# Patient Record
Sex: Female | Born: 1960
Health system: Southern US, Community
[De-identification: ages and names within clinical notes are randomized; demographics above are authoritative.]

## PROBLEM LIST (undated history)

## (undated) DIAGNOSIS — R112 Nausea with vomiting, unspecified: Secondary | ICD-10-CM

## (undated) DIAGNOSIS — M1990 Unspecified osteoarthritis, unspecified site: Secondary | ICD-10-CM

## (undated) DIAGNOSIS — R0600 Dyspnea, unspecified: Secondary | ICD-10-CM

## (undated) DIAGNOSIS — I10 Essential (primary) hypertension: Secondary | ICD-10-CM

## (undated) DIAGNOSIS — K219 Gastro-esophageal reflux disease without esophagitis: Secondary | ICD-10-CM

## (undated) DIAGNOSIS — Z9889 Other specified postprocedural states: Secondary | ICD-10-CM

## (undated) DIAGNOSIS — Z9884 Bariatric surgery status: Secondary | ICD-10-CM

## (undated) DIAGNOSIS — J189 Pneumonia, unspecified organism: Secondary | ICD-10-CM

## (undated) DIAGNOSIS — G4730 Sleep apnea, unspecified: Secondary | ICD-10-CM

## (undated) HISTORY — PX: HERNIA REPAIR: SHX51

## (undated) HISTORY — PX: TUBAL LIGATION: SHX77

## (undated) HISTORY — PX: LAPAROSCOPIC GASTRIC BANDING: SHX1100

## (undated) HISTORY — PX: CERVICAL CONIZATION W/BX: SHX1330

## (undated) HISTORY — DX: Essential (primary) hypertension: I10

---

## 1998-06-13 ENCOUNTER — Other Ambulatory Visit: Admission: RE | Admit: 1998-06-13 | Discharge: 1998-06-13 | Payer: Self-pay | Admitting: Obstetrics and Gynecology

## 1999-02-09 ENCOUNTER — Emergency Department (HOSPITAL_COMMUNITY): Admission: EM | Admit: 1999-02-09 | Discharge: 1999-02-10 | Payer: Self-pay | Admitting: Emergency Medicine

## 1999-02-09 ENCOUNTER — Encounter: Payer: Self-pay | Admitting: Emergency Medicine

## 1999-02-10 ENCOUNTER — Encounter: Payer: Self-pay | Admitting: Emergency Medicine

## 2000-04-09 ENCOUNTER — Other Ambulatory Visit: Admission: RE | Admit: 2000-04-09 | Discharge: 2000-04-09 | Payer: Self-pay | Admitting: Obstetrics and Gynecology

## 2000-11-21 ENCOUNTER — Ambulatory Visit (HOSPITAL_COMMUNITY): Admission: RE | Admit: 2000-11-21 | Discharge: 2000-11-21 | Payer: Self-pay | Admitting: Family Medicine

## 2000-11-21 ENCOUNTER — Encounter: Payer: Self-pay | Admitting: Family Medicine

## 2001-05-07 ENCOUNTER — Ambulatory Visit (HOSPITAL_COMMUNITY): Admission: RE | Admit: 2001-05-07 | Discharge: 2001-05-07 | Payer: Self-pay | Admitting: Family Medicine

## 2001-05-07 ENCOUNTER — Encounter: Payer: Self-pay | Admitting: Family Medicine

## 2002-01-30 ENCOUNTER — Encounter: Payer: Self-pay | Admitting: *Deleted

## 2002-01-30 ENCOUNTER — Emergency Department (HOSPITAL_COMMUNITY): Admission: EM | Admit: 2002-01-30 | Discharge: 2002-01-30 | Payer: Self-pay | Admitting: *Deleted

## 2003-04-13 ENCOUNTER — Ambulatory Visit (HOSPITAL_COMMUNITY): Admission: RE | Admit: 2003-04-13 | Discharge: 2003-04-13 | Payer: Self-pay | Admitting: Family Medicine

## 2007-03-18 ENCOUNTER — Encounter: Admission: RE | Admit: 2007-03-18 | Discharge: 2007-03-18 | Payer: Self-pay | Admitting: Geriatric Medicine

## 2007-04-23 ENCOUNTER — Encounter: Admission: RE | Admit: 2007-04-23 | Discharge: 2007-04-23 | Payer: Self-pay | Admitting: Geriatric Medicine

## 2008-08-29 ENCOUNTER — Ambulatory Visit (HOSPITAL_COMMUNITY): Admission: RE | Admit: 2008-08-29 | Discharge: 2008-08-29 | Payer: Self-pay | Admitting: Surgery

## 2008-08-30 ENCOUNTER — Encounter: Admission: RE | Admit: 2008-08-30 | Discharge: 2008-11-28 | Payer: Self-pay | Admitting: Surgery

## 2008-09-06 ENCOUNTER — Ambulatory Visit (HOSPITAL_COMMUNITY): Admission: RE | Admit: 2008-09-06 | Discharge: 2008-09-06 | Payer: Self-pay | Admitting: Surgery

## 2008-09-06 ENCOUNTER — Ambulatory Visit (HOSPITAL_BASED_OUTPATIENT_CLINIC_OR_DEPARTMENT_OTHER): Admission: RE | Admit: 2008-09-06 | Discharge: 2008-09-06 | Payer: Self-pay | Admitting: Surgery

## 2008-09-17 ENCOUNTER — Ambulatory Visit: Payer: Self-pay | Admitting: Internal Medicine

## 2008-11-28 ENCOUNTER — Ambulatory Visit (HOSPITAL_COMMUNITY): Admission: RE | Admit: 2008-11-28 | Discharge: 2008-11-29 | Payer: Self-pay | Admitting: Surgery

## 2008-11-30 ENCOUNTER — Inpatient Hospital Stay (HOSPITAL_COMMUNITY): Admission: EM | Admit: 2008-11-30 | Discharge: 2008-12-01 | Payer: Self-pay | Admitting: Emergency Medicine

## 2008-12-12 ENCOUNTER — Encounter: Admission: RE | Admit: 2008-12-12 | Discharge: 2009-02-08 | Payer: Self-pay | Admitting: Surgery

## 2009-03-13 ENCOUNTER — Encounter: Admission: RE | Admit: 2009-03-13 | Discharge: 2009-04-24 | Payer: Self-pay | Admitting: Surgery

## 2009-06-26 ENCOUNTER — Encounter: Admission: RE | Admit: 2009-06-26 | Discharge: 2009-07-27 | Payer: Self-pay | Admitting: Surgery

## 2009-10-09 ENCOUNTER — Encounter: Admission: RE | Admit: 2009-10-09 | Discharge: 2009-11-09 | Payer: Self-pay | Admitting: Surgery

## 2009-12-11 ENCOUNTER — Encounter
Admission: RE | Admit: 2009-12-11 | Discharge: 2009-12-11 | Payer: Self-pay | Source: Home / Self Care | Attending: Surgery | Admitting: Surgery

## 2010-03-04 ENCOUNTER — Encounter: Payer: Self-pay | Admitting: Geriatric Medicine

## 2010-05-17 LAB — DIFFERENTIAL
Basophils Absolute: 0 10*3/uL (ref 0.0–0.1)
Basophils Relative: 0 % (ref 0–1)
Eosinophils Absolute: 0.1 10*3/uL (ref 0.0–0.7)
Eosinophils Relative: 1 % (ref 0–5)
Lymphocytes Relative: 17 % (ref 12–46)
Lymphocytes Relative: 17 % (ref 12–46)
Lymphocytes Relative: 26 % (ref 12–46)
Lymphs Abs: 1.1 10*3/uL (ref 0.7–4.0)
Monocytes Absolute: 0.5 10*3/uL (ref 0.1–1.0)
Monocytes Absolute: 0.6 10*3/uL (ref 0.1–1.0)
Monocytes Absolute: 0.6 10*3/uL (ref 0.1–1.0)
Monocytes Relative: 10 % (ref 3–12)
Monocytes Relative: 10 % (ref 3–12)
Monocytes Relative: 8 % (ref 3–12)
Neutro Abs: 3.7 10*3/uL (ref 1.7–7.7)
Neutro Abs: 4.6 10*3/uL (ref 1.7–7.7)
Neutrophils Relative %: 73 % (ref 43–77)

## 2010-05-17 LAB — CBC
HCT: 33.8 % — ABNORMAL LOW (ref 36.0–46.0)
HCT: 35.5 % — ABNORMAL LOW (ref 36.0–46.0)
Hemoglobin: 10.6 g/dL — ABNORMAL LOW (ref 12.0–15.0)
Hemoglobin: 11.3 g/dL — ABNORMAL LOW (ref 12.0–15.0)
MCHC: 33.4 g/dL (ref 30.0–36.0)
MCHC: 34 g/dL (ref 30.0–36.0)
MCV: 84.2 fL (ref 78.0–100.0)
MCV: 84.8 fL (ref 78.0–100.0)
Platelets: 275 10*3/uL (ref 150–400)
RBC: 3.69 MIL/uL — ABNORMAL LOW (ref 3.87–5.11)
RBC: 3.87 MIL/uL (ref 3.87–5.11)
RDW: 15.6 % — ABNORMAL HIGH (ref 11.5–15.5)
RDW: 15.6 % — ABNORMAL HIGH (ref 11.5–15.5)
RDW: 16 % — ABNORMAL HIGH (ref 11.5–15.5)
WBC: 6.9 10*3/uL (ref 4.0–10.5)

## 2010-05-17 LAB — COMPREHENSIVE METABOLIC PANEL
AST: 19 U/L (ref 0–37)
Albumin: 3.7 g/dL (ref 3.5–5.2)
Alkaline Phosphatase: 70 U/L (ref 39–117)
BUN: 13 mg/dL (ref 6–23)
Creatinine, Ser: 0.68 mg/dL (ref 0.4–1.2)
GFR calc Af Amer: >60 mL/min (ref >60)
Potassium: 4.4 mEq/L (ref 3.5–5.1)
Total Protein: 7.6 g/dL (ref 6.0–8.3)

## 2010-05-17 LAB — BASIC METABOLIC PANEL
BUN: 5 mg/dL — ABNORMAL LOW (ref 6–23)
BUN: 5 mg/dL — ABNORMAL LOW (ref 6–23)
CO2: 25 mEq/L (ref 19–32)
Calcium: 8.3 mg/dL — ABNORMAL LOW (ref 8.4–10.5)
Chloride: 106 mEq/L (ref 96–112)
Chloride: 107 mEq/L (ref 96–112)
Creatinine, Ser: 0.6 mg/dL (ref 0.4–1.2)
GFR calc non Af Amer: >60 mL/min (ref >60)
Glucose, Bld: 114 mg/dL — ABNORMAL HIGH (ref 70–99)
Potassium: 3.9 mEq/L (ref 3.5–5.1)
Sodium: 137 mEq/L (ref 135–145)

## 2010-05-17 LAB — PREGNANCY, URINE: Preg Test, Ur: NEGATIVE

## 2010-06-26 NOTE — Procedures (Signed)
NAMESAMIHA, DENAPOLI               ACCOUNT NO.:  [id number]   MEDICAL RECORD NO.:  [id number]          PATIENT TYPE:  OUT   LOCATION:  SLEEP CENTER                 FACILITY:  [facility]   PHYSICIAN:  Clinton D. Maple Hudson, MD, FCCP, FACPDATE OF BIRTH:  12/26/59   DATE OF STUDY:  09/06/2008                            NOCTURNAL POLYSOMNOGRAM   REFERRING PHYSICIAN:  Thornton Park. Daphine Deutscher, MD   INDICATION FOR STUDY:  Hypersomnia with sleep apnea.   EPWORTH SLEEPINESS SCORE:  Epworth sleepiness score 4/24, BMI 44.3,  weight 242 pounds, height 62 inches, neck 14.5 inches.   MEDICATIONS:  Home medications charted and reviewed.   SLEEP ARCHITECTURE:  Split study protocol.  During the diagnostic phase,  total sleep time 173.5 minutes with sleep efficiency 65.6%.  Stage I was  21%, stage II 65.1%, stage III 13.8%, REM absent.  Sleep latency 52.5  minutes.  Awake after sleep onset 30 minutes.  Arousal index 82.0  indicating increased EEG arousal.  No bedtime medication was reported.   RESPIRATORY DATA:  Split study protocol.  Apnea/hypopnea index (AHI)  21.1 per hour.  A total of 61 events were scored including 7 obstructive  apneas and 54 hypopneas.  Events were more common while supine.  CPAP  was then titrated to 12 CWP, AHI 0 per hour.  She wore a small ResMed  Quattro full face mask with heated humidifier.   OXYGEN DATA:  Moderately loud snoring before CPAP with oxygen  desaturation to a nadir of 85%.  After CPAP control, mean oxygen  saturation held at 97.1% on room air.   CARDIAC DATA:  Sinus rhythm with PVCs.   MOVEMENT-PARASOMNIA:  Periodic limb movement.  During the diagnostic  phase, there were 7 limb jerks associated with arousals, 2.4 per hour.  During the titration phase, there were 10 limb jerks with arousal for an  index of 3.4 per hour.   IMPRESSIONS-RECOMMENDATIONS:  1. Moderate obstructive sleep apnea/hypopnea syndrome, AHI 21.1 per      hour.  Events were more common while  supine.  Moderately loud      snoring with oxygen desaturation to a nadir of 85%.  2. Successful CPAP titration to 12 CWP, AHI 0 per hour.  She wore a      small ResMed Quattro full face mask with heated humidifier.      Snoring was prevented and oxygenation improved.  3. 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      Clinton D. Maple Hudson, MD, [facility], FACP  Diplomate, Biomedical engineer of Sleep Medicine  Electronically Signed     CDY/MEDQ  D:  09/17/2008 10:17:50  T:  09/17/2008 11:12:16  Job:  [id number]

## 2010-09-14 ENCOUNTER — Encounter (INDEPENDENT_AMBULATORY_CARE_PROVIDER_SITE_OTHER): Payer: Self-pay

## 2010-09-14 ENCOUNTER — Ambulatory Visit (INDEPENDENT_AMBULATORY_CARE_PROVIDER_SITE_OTHER): Payer: BC Managed Care – PPO | Admitting: Physician Assistant

## 2010-09-14 NOTE — Progress Notes (Signed)
  HISTORY: Deanna Edwards is a 50 y.o.female who received an AP-Large lap-band in October 2010 by Dr. Daphine Deutscher. She comes in today having last been seen in April and has been doing very well. She denies vomiting or regurgitation symptoms and says her hunger and portion sizes are under excellent control. She would like to maintain her current weight.  VITAL SIGNS: Filed Vitals:   09/14/10 1508  BP: 128/84    PHYSICAL EXAM: Physical exam reveals a very well-appearing 50 y.o.female in no apparent distress Neurologic: Awake, alert, oriented Psych: Bright affect, conversant Respiratory: Breathing even and unlabored. No stridor or wheezing Extremities: Atraumatic, good range of motion. Skin: Warm, Dry, no rashes Musculoskeletal: Normal gait, Joints normal  ASSESMENT: 50 y.o.  female  s/p AP-Large lap-band. She is doing very well.  PLAN: We discussed strategies to maintain her current weight. We'll have her back in 2 months for her 2 year anniversary appointment.

## 2010-09-14 NOTE — Patient Instructions (Signed)
Return in 2 months or sooner if necessary. 

## 2010-10-24 IMAGING — CR DG CHEST 2V
2 series · 2 of 2 positions shown · non-contrast
Comparison: April 13, 2003

CLINICAL DATA: Preoperative screening for bariatric procedure

CHEST - 2 VIEW

[view not recorded (1 of 2)]
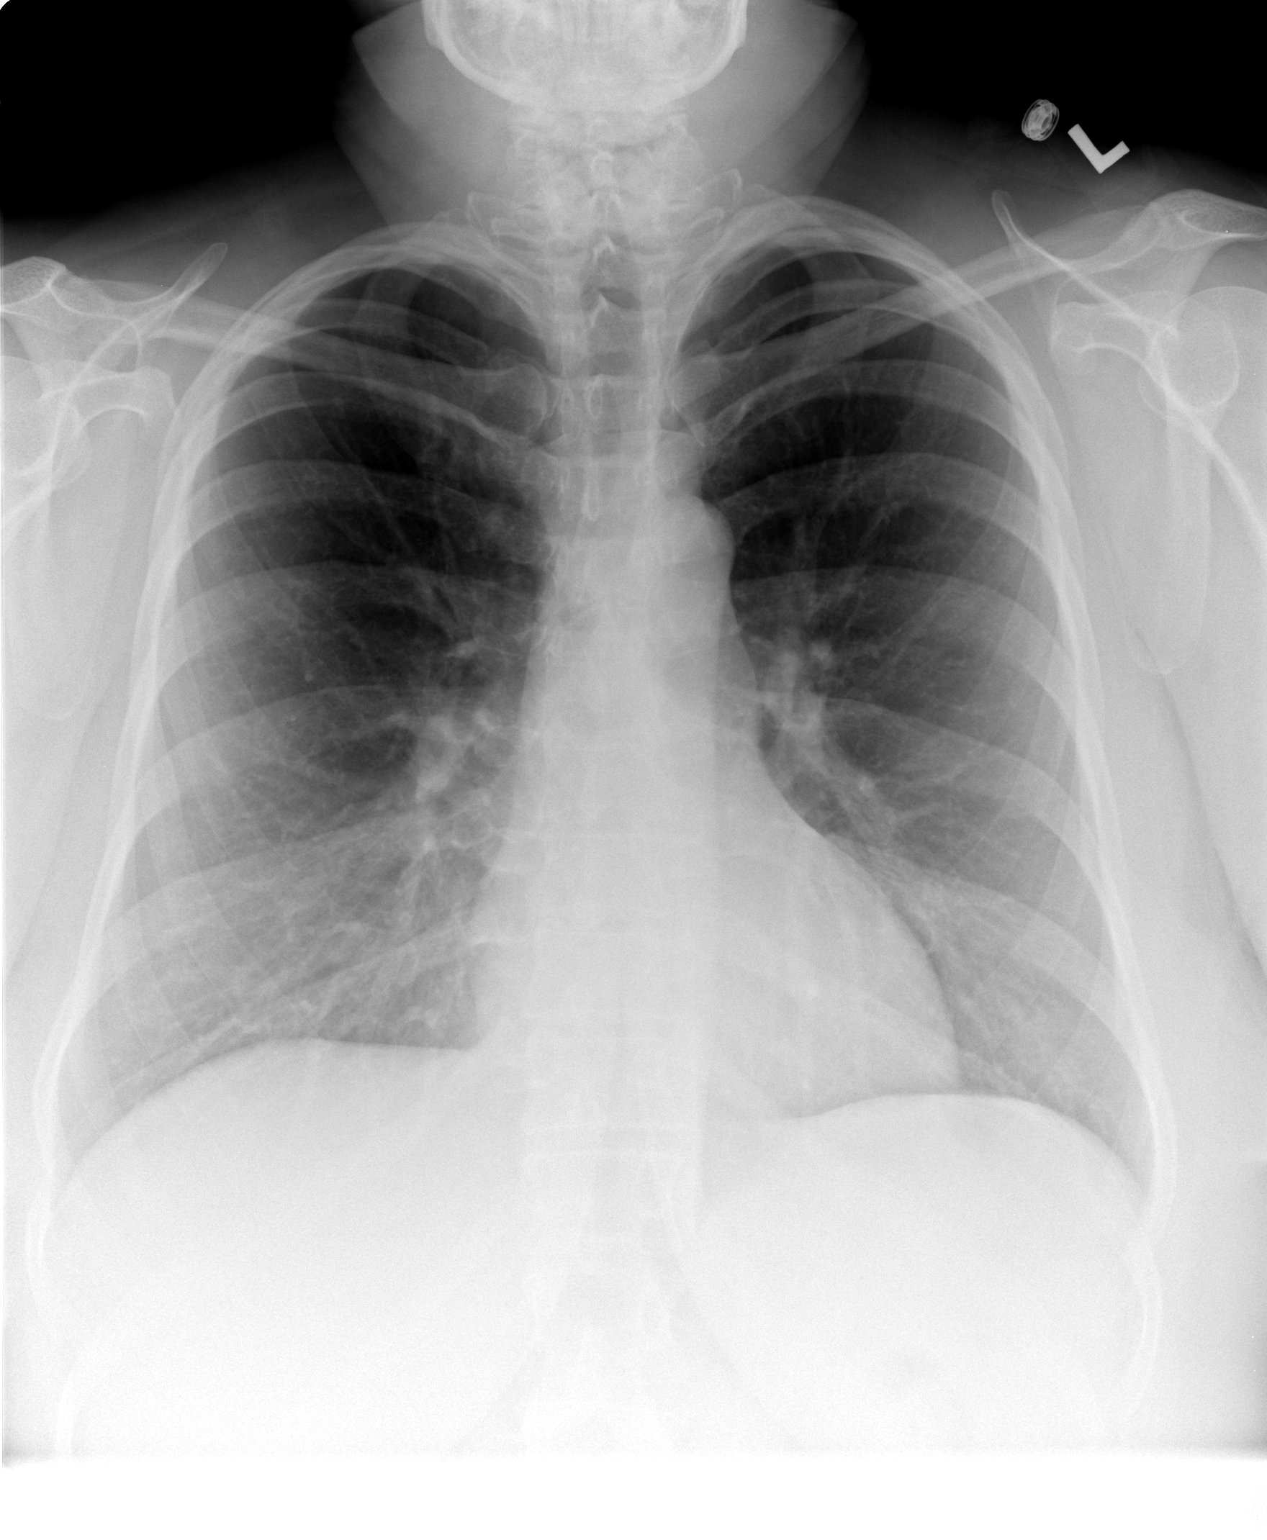

[view not recorded (2 of 2)]
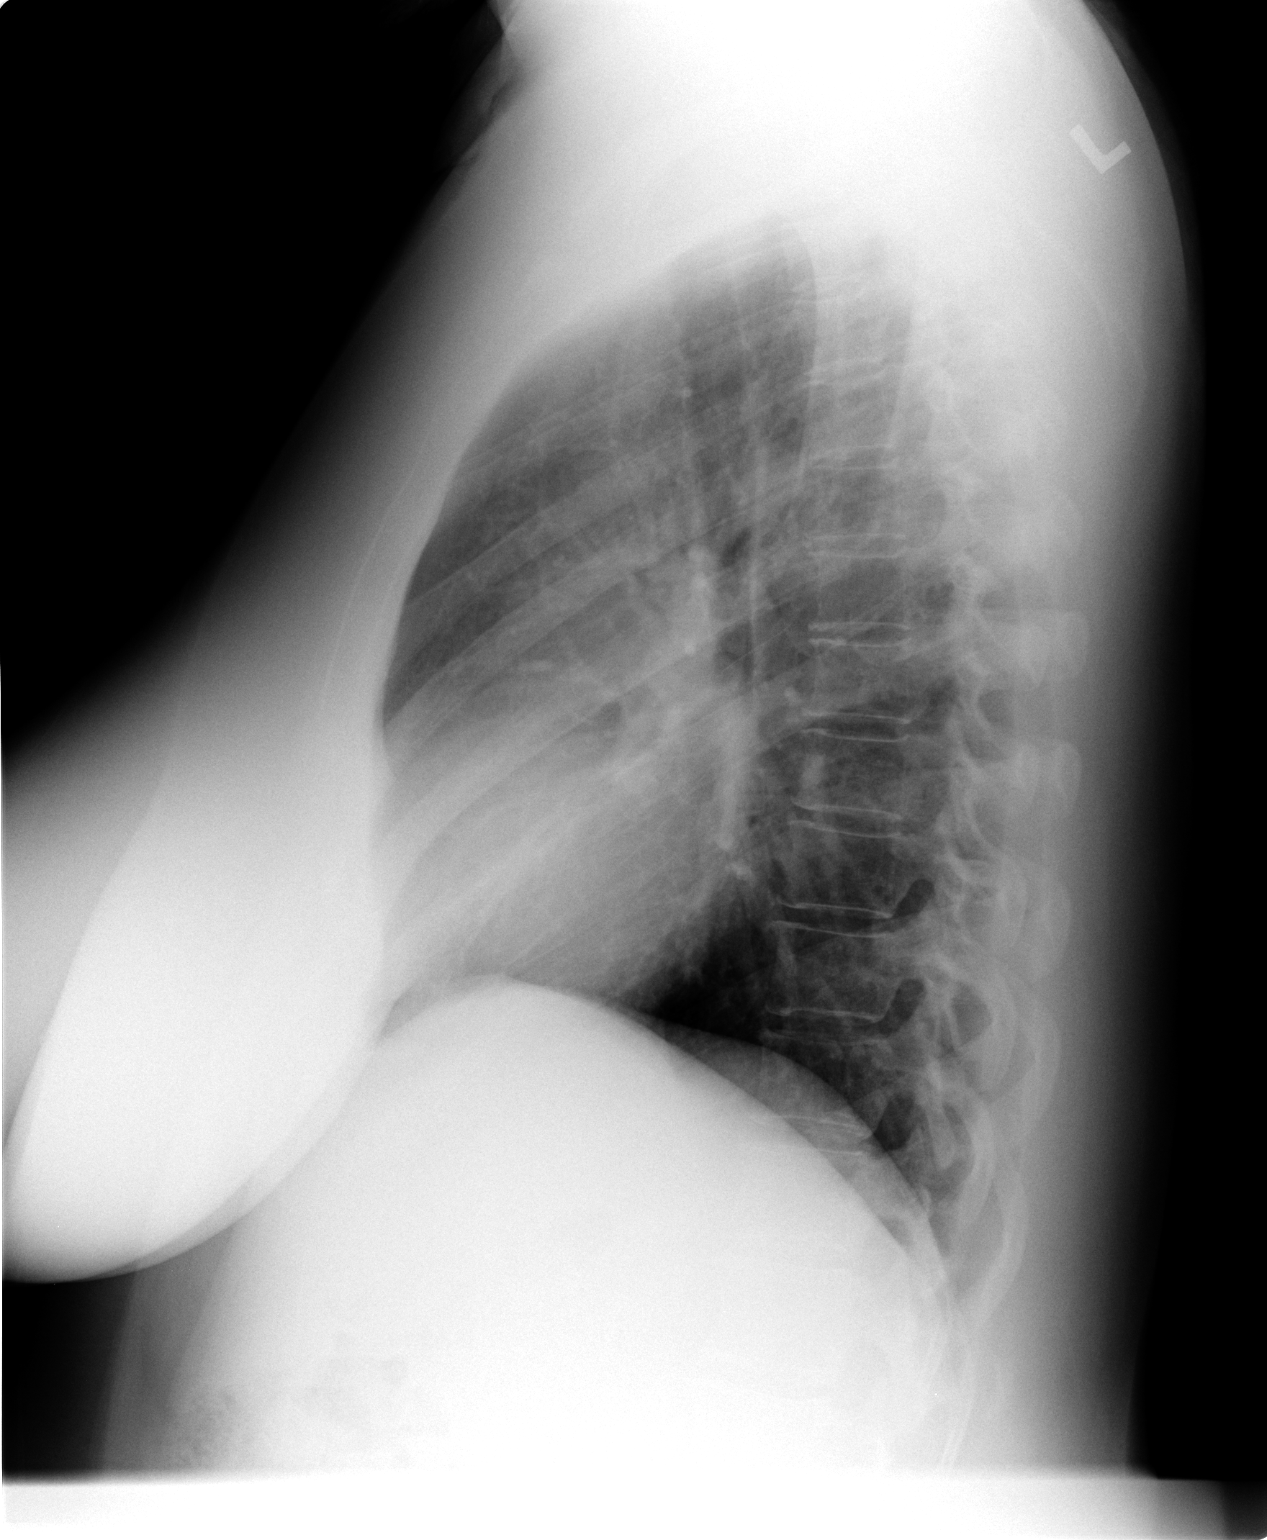

[2 of 2 positions shown; findings below may reference images not displayed]

FINDINGS: The cardiac silhouette, mediastinum, pulmonary
vasculature are within normal limits.  Both lungs are clear.  The
osseous structures are unremarkable.
IMPRESSION: Normal, stable chest x-ray.

## 2010-11-30 ENCOUNTER — Ambulatory Visit (INDEPENDENT_AMBULATORY_CARE_PROVIDER_SITE_OTHER): Payer: BC Managed Care – PPO | Admitting: Physician Assistant

## 2010-11-30 ENCOUNTER — Encounter (INDEPENDENT_AMBULATORY_CARE_PROVIDER_SITE_OTHER): Payer: Self-pay | Admitting: Physician Assistant

## 2010-11-30 VITALS — BP 130/86 | HR 66 | Temp 97.1°F | Resp 16 | Ht 62.0 in | Wt 134.8 lb

## 2010-11-30 DIAGNOSIS — Z4651 Encounter for fitting and adjustment of gastric lap band: Secondary | ICD-10-CM

## 2010-11-30 DIAGNOSIS — Z8639 Personal history of other endocrine, nutritional and metabolic disease: Secondary | ICD-10-CM

## 2010-11-30 DIAGNOSIS — Z87898 Personal history of other specified conditions: Secondary | ICD-10-CM

## 2010-11-30 NOTE — Progress Notes (Signed)
  HISTORY: Deanna Edwards is a 50 y.o.female who received an AP-Large lap-band in October 2010 by Dr. Daphine Deutscher. She comes in today having last been seen in August and has lost 7 more pounds. She doesn't want to lose any more weight. She says she's incorporated carbs back in her diet but simply isn't that hungry. She's like to try having a little fluid removed in the hope that it'll stabilize her weight.  VITAL SIGNS: Filed Vitals:   11/30/10 1020  BP: 130/86  Pulse: 66  Temp: 97.1 F (36.2 C)  Resp: 16    PHYSICAL EXAM: Physical exam reveals a very well-appearing 50 y.o.female in no apparent distress Neurologic: Awake, alert, oriented Psych: Bright affect, conversant Respiratory: Breathing even and unlabored. No stridor or wheezing Abdomen: Soft, nontender, nondistended to palpation. Incisions well-healed. No incisional hernias. Port easily palpated. Extremities: Atraumatic, good range of motion.  ASSESMENT: 50 y.o.  female  s/p AP-Large lap-band.   PLAN: The patient's port was accessed with a 20G Huber needle without difficulty. Clear fluid was aspirated and 0.2 mL saline was removed from the port to give a total predicted volume of 7.8 mL. The patient was able to swallow water without difficulty following the procedure and was instructed to take clear liquids for the next 24-48 hours and advance slowly as tolerated.

## 2010-11-30 NOTE — Patient Instructions (Signed)
Return in 6 months or as needed. Call for an appointment with Dr. Daphine Deutscher if you continue to lose weight.

## 2011-04-29 ENCOUNTER — Encounter (INDEPENDENT_AMBULATORY_CARE_PROVIDER_SITE_OTHER): Payer: Self-pay | Admitting: General Surgery

## 2012-05-07 ENCOUNTER — Encounter (INDEPENDENT_AMBULATORY_CARE_PROVIDER_SITE_OTHER): Payer: BC Managed Care – PPO

## 2012-06-25 ENCOUNTER — Encounter (INDEPENDENT_AMBULATORY_CARE_PROVIDER_SITE_OTHER): Payer: Self-pay

## 2012-06-25 ENCOUNTER — Encounter (INDEPENDENT_AMBULATORY_CARE_PROVIDER_SITE_OTHER): Payer: BC Managed Care – PPO

## 2012-06-25 ENCOUNTER — Ambulatory Visit
Admission: RE | Admit: 2012-06-25 | Discharge: 2012-06-25 | Disposition: A | Discharge status: Home / Self Care | Payer: BC Managed Care – PPO | Source: Ambulatory Visit | Attending: Physician Assistant | Admitting: Physician Assistant

## 2012-06-25 ENCOUNTER — Ambulatory Visit (INDEPENDENT_AMBULATORY_CARE_PROVIDER_SITE_OTHER): Payer: BC Managed Care – PPO | Admitting: Physician Assistant

## 2012-06-25 DIAGNOSIS — Z9884 Bariatric surgery status: Secondary | ICD-10-CM

## 2012-06-25 NOTE — Patient Instructions (Signed)
Obtain your x-ray today. Our office will contact you regarding scheduling your procedure. Continue to watch your portion sizes and food choices.

## 2012-06-25 NOTE — Progress Notes (Signed)
  HISTORY: Deanna Edwards is a 52 y.o.female who received an AP-Large lap-band in October 2010 by Dr. Daphine Deutscher. She was last seen in October 2012 for fluid removal secondary to excessive weight loss. She reports doing very well until February of this year when she fell ill with an upper respiratory ailment. Following her recovery she noticed that she was able to eat pretty much anything without restriction. This has persisted to the point that she's gained 38 lbs. She describes this lack of restriction as being literally 'overnight'.  VITAL SIGNS: Filed Vitals:   06/25/12 1045  BP: 130/78  Pulse: 76  Temp: 98.1 F (36.7 C)  Resp: 18    PHYSICAL EXAM: Physical exam reveals a very well-appearing 52 y.o.female in no apparent distress Neurologic: Awake, alert, oriented Psych: Bright affect, conversant Respiratory: Breathing even and unlabored. No stridor or wheezing Extremities: Atraumatic, good range of motion. Skin: Warm, Dry, no rashes Musculoskeletal: Normal gait, Joints normal Abdomen: Soft, nontender, nondistended. Port in good position. No tenderness or hernia at port site. Incisions intact without evidence of incisional hernia.  ASSESMENT: 52 y.o.  female  s/p AP-Large lap-band. Suspect port leak.  PLAN: I accessed her port with a 20 G huber needle. A very small (ca. 0.41mL) volume of what appeared to be old blood returned but no clear fluid. I added 3 mL saline, which I was unable to subsequently withdraw. I suspect a port leak. I've discussed the case with Dr. Daphine Deutscher. We will obtain a KUB today to assess port position, band position and whether the tubing is contiguous. We will schedule her for port revision at the earliest available opportunity. The plan was discussed with the patient in detail. She voiced understanding and agreement.

## 2012-07-01 ENCOUNTER — Telehealth (INDEPENDENT_AMBULATORY_CARE_PROVIDER_SITE_OTHER): Payer: Self-pay

## 2012-07-01 NOTE — Telephone Encounter (Signed)
Pt calling for x-ray results.  She would like Mardelle Matte to contact her to discuss follow up.  Please call her work number [phone].  She did say that a message could be left.

## 2012-07-02 ENCOUNTER — Telehealth (INDEPENDENT_AMBULATORY_CARE_PROVIDER_SITE_OTHER): Payer: Self-pay

## 2012-07-02 NOTE — Telephone Encounter (Signed)
Pt called asking if PA or MD has reviewed her imaging. I advised pt no msg in epic that imaging has been reviewed but I will route msg to Dr Daphine Deutscher and his assistant for review and to call pt asap with result.

## 2012-07-08 ENCOUNTER — Telehealth (INDEPENDENT_AMBULATORY_CARE_PROVIDER_SITE_OTHER): Payer: Self-pay | Admitting: General Surgery

## 2012-07-08 NOTE — Telephone Encounter (Signed)
Patient calling status post lap band placement stating that her lap band has a leak in it and she is supposed to follow up with Dr Daphine Deutscher. She has not heard anything. Please call patient. [phone].

## 2012-07-10 ENCOUNTER — Encounter (INDEPENDENT_AMBULATORY_CARE_PROVIDER_SITE_OTHER): Payer: Self-pay | Admitting: Surgery

## 2012-07-10 ENCOUNTER — Ambulatory Visit (INDEPENDENT_AMBULATORY_CARE_PROVIDER_SITE_OTHER): Payer: BC Managed Care – PPO | Admitting: Surgery

## 2012-07-10 VITALS — BP 160/82 | HR 68 | Temp 98.6°F | Resp 16 | Ht 62.0 in | Wt 173.8 lb

## 2012-07-10 DIAGNOSIS — Z9884 Bariatric surgery status: Secondary | ICD-10-CM

## 2012-07-10 NOTE — Progress Notes (Signed)
Chief Complaint:  Leaking lapband port  History of Present Illness:  Deanna Edwards is an 52 y.o. female who has had no restriction since February.  This occurred rather suddenly.  Mardelle Matte and I both have access her port and the fluid goes out immediately as if the tubing is not intact.  Xray showed the tubing to be in continuity but this cannot rule out a lacerated or tear at the port tubing junction  Past Medical History  Diagnosis Date  . Hypertension     Past Surgical History  Procedure Laterality Date  . Hernia repair    . Laparoscopic gastric banding      Current Outpatient Prescriptions  Medication Sig Dispense Refill  . azithromycin (ZITHROMAX) 250 MG tablet       . chlorhexidine (PERIDEX) 0.12 % solution       . HYDROcodone-acetaminophen (NORCO/VICODIN) 5-325 MG per tablet       . predniSONE (DELTASONE) 20 MG tablet        No current facility-administered medications for this visit.   Codeine and Sulfa antibiotics Family History  Problem Relation Age of Onset  . Hypertension Father   . Emphysema Father   . Asthma Father   . Hypertension Sister   . Cancer Maternal Grandmother     Breast   Social History:   reports that she quit smoking about 17 years ago. Her smoking use included Cigarettes. She smoked 0.00 packs per day. She has never used smokeless tobacco. She reports that she does not drink alcohol or use illicit drugs.   REVIEW OF SYSTEMS - PERTINENT POSITIVES ONLY: No changes  Physical Exam:   Blood pressure 160/82, pulse 68, temperature 98.6 F (37 C), temperature source Temporal, resp. rate 16, height 5\' 2"  (1.575 m), weight 173 lb 12.8 oz (78.835 kg), last menstrual period 06/25/2012. Body mass index is 31.78 kg/(m^2).  Gen:  WDWN WF NAD  Neurological: Alert and oriented to person, place, and time. Motor and sensory function is grossly intact  Head: Normocephalic and atraumatic.  Eyes: Conjunctivae are normal. Pupils are equal, round, and reactive to  light. No scleral icterus.  Neck: Normal range of motion. Neck supple. No tracheal deviation or thyromegaly present.  Cardiovascular:  SR without murmurs or gallops.  No carotid bruits Respiratory: Effort normal.  No respiratory distress. No chest wall tenderness. Breath sounds normal.  No wheezes, rales or rhonchi.  Abdomen:  Port easily accessed and 5 cc injected and was not retrievible GU: Musculoskeletal: Normal range of motion. Extremities are nontender. No cyanosis, edema or clubbing noted Lymphadenopathy: No cervical, preauricular, postauricular or axillary adenopathy is present Skin: Skin is warm and dry. No rash noted. No diaphoresis. No erythema. No pallor. Pscyh: Normal mood and affect. Behavior is normal. Judgment and thought content normal.   LABORATORY RESULTS: No results found for this or any previous visit (from the past 48 hour(s)).  RADIOLOGY RESULTS: No results found.  Problem List: There are no active problems to display for this patient.   Assessment & Plan: Leaking lapband port.      Matt B. Daphine Deutscher, MD, [facility] [phone] beeper [phone]  07/10/2012 5:34 PM

## 2012-07-10 NOTE — Patient Instructions (Addendum)
Thanks for your patience.  If you need further assistance after leaving the office, please call our office and speak with a [facility] nurse.  [phone].  If you want to leave a message for Dr. Shaylee Stanislawski, please call his office phone at [phone]. 

## 2012-08-06 NOTE — Progress Notes (Signed)
Surgery scheduled for 08/25/12.  Need orders in EPIC.  Thank You.  

## 2012-08-13 ENCOUNTER — Encounter (HOSPITAL_COMMUNITY): Payer: Self-pay | Admitting: Pharmacy Technician

## 2012-08-21 ENCOUNTER — Encounter (HOSPITAL_COMMUNITY)
Admission: RE | Admit: 2012-08-21 | Discharge: 2012-08-21 | Disposition: A | Discharge status: Home / Self Care | Payer: BC Managed Care – PPO | Source: Ambulatory Visit | Attending: Surgery | Admitting: Surgery

## 2012-08-21 ENCOUNTER — Ambulatory Visit (HOSPITAL_COMMUNITY)
Admission: RE | Admit: 2012-08-21 | Discharge: 2012-08-21 | Disposition: A | Discharge status: Home / Self Care | Payer: BC Managed Care – PPO | Source: Ambulatory Visit | Attending: Surgery | Admitting: Surgery

## 2012-08-21 ENCOUNTER — Encounter (HOSPITAL_COMMUNITY): Payer: Self-pay

## 2012-08-21 DIAGNOSIS — K9509 Other complications of gastric band procedure: Secondary | ICD-10-CM | POA: Insufficient documentation

## 2012-08-21 DIAGNOSIS — Z01810 Encounter for preprocedural cardiovascular examination: Secondary | ICD-10-CM | POA: Insufficient documentation

## 2012-08-21 DIAGNOSIS — Z9884 Bariatric surgery status: Secondary | ICD-10-CM

## 2012-08-21 HISTORY — DX: Sleep apnea, unspecified: G47.30

## 2012-08-21 HISTORY — DX: Bariatric surgery status: Z98.84

## 2012-08-21 HISTORY — DX: Gastro-esophageal reflux disease without esophagitis: K21.9

## 2012-08-21 LAB — BASIC METABOLIC PANEL
BUN: 9 mg/dL (ref 6–23)
CO2: 27 mEq/L (ref 19–32)
Glucose, Bld: 87 mg/dL (ref 70–99)
Potassium: 4.2 mEq/L (ref 3.5–5.1)
Sodium: 139 mEq/L (ref 135–145)

## 2012-08-21 NOTE — Patient Instructions (Addendum)
20 Marlaya Coke  08/21/2012   Your procedure is scheduled on  7-15  -2014  Report to [facility] at   0830     AM.  Call this number if you have problems the morning of surgery: [phone]  Or Presurgical Testing [phone](Zynasia Burklow)   For Cpap use: Bring mask and tubing only.   Do not eat food:After Midnight.    Take these medicines the morning of surgery with A SIP OF WATER: none   Do not wear jewelry, make-up or nail polish.  Do not wear lotions, powders, or perfumes. You may wear deodorant.  Do not shave 12 hours prior to first CHG shower(legs and under arms).(face and neck okay.)  Do not bring valuables to the hospital.  Contacts, dentures or bridgework,body piercing,  may not be worn into surgery.  Leave suitcase in the car. After surgery it may be brought to your room.  For patients admitted to the hospital, checkout time is 11:00 AM the day of discharge.   Patients discharged the day of surgery will not be allowed to drive home. Must have responsible person with you x 24 hours once discharged.  Name and phone number of your driver: Mayzee Reichenbach [phone] cell  Special Instructions: CHG(Chlorhedine 4%-"Hibiclens","Betasept","Aplicare") Shower Use Special Wash: see special instructions.(avoid face and genitals.)    Failure to follow these instructions may result in Cancellation of your surgery.   Patient signature_______________________________________________________

## 2012-08-21 NOTE — Progress Notes (Signed)
08-21-12 1000 -pt. Here now for presurgical testing visit-Need MD order entry- ASAP.

## 2012-08-21 NOTE — Pre-Procedure Instructions (Signed)
08-21-12 EKG/ CXR done today. 

## 2012-08-24 ENCOUNTER — Other Ambulatory Visit (INDEPENDENT_AMBULATORY_CARE_PROVIDER_SITE_OTHER): Payer: Self-pay | Admitting: Surgery

## 2012-08-24 ENCOUNTER — Telehealth (INDEPENDENT_AMBULATORY_CARE_PROVIDER_SITE_OTHER): Payer: Self-pay

## 2012-08-24 ENCOUNTER — Encounter (INDEPENDENT_AMBULATORY_CARE_PROVIDER_SITE_OTHER): Payer: Self-pay

## 2012-08-24 NOTE — Telephone Encounter (Signed)
Spoke with pt letting her know that I have her postop appt scheduled for August 6.  That I will write a note keeping her out of work until that date.  Pt wondered if today could be considered part of her FMLA since she is having surgery tomorrow.  I let her know that I didn't see that being an issue since she has personal things to take care of as well as a possible bowel prep prior to surgery.  Pt will come into the office today to pick up letter.

## 2012-08-25 ENCOUNTER — Ambulatory Visit (HOSPITAL_COMMUNITY): Payer: BC Managed Care – PPO | Admitting: Anesthesiology

## 2012-08-25 ENCOUNTER — Ambulatory Visit (HOSPITAL_COMMUNITY)
Admission: RE | Admit: 2012-08-25 | Discharge: 2012-08-25 | Disposition: A | Discharge status: Home / Self Care | Payer: BC Managed Care – PPO | Source: Ambulatory Visit | Attending: Surgery | Admitting: Surgery

## 2012-08-25 ENCOUNTER — Encounter (HOSPITAL_COMMUNITY): Payer: Self-pay | Admitting: Anesthesiology

## 2012-08-25 ENCOUNTER — Encounter (HOSPITAL_COMMUNITY)
Admission: RE | Disposition: A | Discharge status: Home / Self Care | Payer: Self-pay | Source: Ambulatory Visit | Attending: Surgery

## 2012-08-25 ENCOUNTER — Encounter (HOSPITAL_COMMUNITY): Payer: Self-pay | Admitting: *Deleted

## 2012-08-25 DIAGNOSIS — T85698A Other mechanical complication of other specified internal prosthetic devices, implants and grafts, initial encounter: Secondary | ICD-10-CM

## 2012-08-25 DIAGNOSIS — Y831 Surgical operation with implant of artificial internal device as the cause of abnormal reaction of the patient, or of later complication, without mention of misadventure at the time of the procedure: Secondary | ICD-10-CM | POA: Insufficient documentation

## 2012-08-25 DIAGNOSIS — I10 Essential (primary) hypertension: Secondary | ICD-10-CM | POA: Insufficient documentation

## 2012-08-25 DIAGNOSIS — K9509 Other complications of gastric band procedure: Secondary | ICD-10-CM | POA: Insufficient documentation

## 2012-08-25 HISTORY — PX: GASTRIC BANDING PORT REVISION: SHX5246

## 2012-08-25 LAB — PREGNANCY, URINE: Preg Test, Ur: NEGATIVE

## 2012-08-25 SURGERY — GASTRIC BANDING PORT REVISION
Anesthesia: General | Site: Abdomen | Wound class: Clean

## 2012-08-25 MED ORDER — CEFAZOLIN SODIUM-DEXTROSE 2-3 GM-% IV SOLR
INTRAVENOUS | Status: AC
  Filled 2012-08-25: qty 50

## 2012-08-25 MED ORDER — EPHEDRINE SULFATE 50 MG/ML IJ SOLN
INTRAMUSCULAR | Status: DC | PRN
  Administered 2012-08-25: 5 mg via INTRAVENOUS

## 2012-08-25 MED ORDER — HYDROMORPHONE HCL PF 1 MG/ML IJ SOLN
INTRAMUSCULAR | Status: AC
  Filled 2012-08-25: qty 1

## 2012-08-25 MED ORDER — LACTATED RINGERS IV SOLN
INTRAVENOUS | Status: DC
  Administered 2012-08-25: 1000 mL via INTRAVENOUS

## 2012-08-25 MED ORDER — SODIUM CHLORIDE 0.9 % IJ SOLN: 3.0000 mL | Freq: Two times a day (BID) | INTRAMUSCULAR | Status: DC

## 2012-08-25 MED ORDER — GLYCOPYRROLATE 0.2 MG/ML IJ SOLN
INTRAMUSCULAR | Status: DC | PRN
  Administered 2012-08-25: .7 mg via INTRAVENOUS

## 2012-08-25 MED ORDER — SODIUM CHLORIDE 0.9 % IJ SOLN
INTRAMUSCULAR | Status: DC | PRN
  Administered 2012-08-25: 5 mL via INTRAVENOUS

## 2012-08-25 MED ORDER — MIDAZOLAM HCL 5 MG/5ML IJ SOLN
INTRAMUSCULAR | Status: DC | PRN
  Administered 2012-08-25: 2 mg via INTRAVENOUS

## 2012-08-25 MED ORDER — 0.9 % SODIUM CHLORIDE (POUR BTL) OPTIME
TOPICAL | Status: DC | PRN
  Administered 2012-08-25: 1000 mL

## 2012-08-25 MED ORDER — SODIUM CHLORIDE 0.9 % IV SOLN: 250.0000 mL | INTRAVENOUS | Status: DC | PRN

## 2012-08-25 MED ORDER — BUPIVACAINE-EPINEPHRINE 0.5% -1:200000 IJ SOLN
INTRAMUSCULAR | Status: DC | PRN
  Administered 2012-08-25: 50 mL

## 2012-08-25 MED ORDER — OXYCODONE HCL 5 MG PO TABS: 5.0000 mg | ORAL_TABLET | ORAL | Status: DC | PRN

## 2012-08-25 MED ORDER — FENTANYL CITRATE 0.05 MG/ML IJ SOLN
INTRAMUSCULAR | Status: DC | PRN
  Administered 2012-08-25: 50 ug via INTRAVENOUS
  Administered 2012-08-25: 100 ug via INTRAVENOUS

## 2012-08-25 MED ORDER — ROCURONIUM BROMIDE 100 MG/10ML IV SOLN
INTRAVENOUS | Status: DC | PRN
  Administered 2012-08-25: 40 mg via INTRAVENOUS

## 2012-08-25 MED ORDER — CEFAZOLIN SODIUM-DEXTROSE 2-3 GM-% IV SOLR
2.0000 g | INTRAVENOUS | Status: AC
  Administered 2012-08-25: 2 g via INTRAVENOUS

## 2012-08-25 MED ORDER — LACTATED RINGERS IV SOLN
INTRAVENOUS | Status: DC
  Administered 2012-08-25: 125 mL/h via INTRAVENOUS

## 2012-08-25 MED ORDER — ACETAMINOPHEN 650 MG RE SUPP
650.0000 mg | RECTAL | Status: DC | PRN
  Filled 2012-08-25: qty 1

## 2012-08-25 MED ORDER — PROMETHAZINE HCL 25 MG/ML IJ SOLN
6.2500 mg | INTRAMUSCULAR | Status: DC | PRN
Start: 2012-08-25 — End: 2012-08-25
  Administered 2012-08-25: 6.25 mg via INTRAVENOUS
  Filled 2012-08-25: qty 1

## 2012-08-25 MED ORDER — PROPOFOL 10 MG/ML IV BOLUS
INTRAVENOUS | Status: DC | PRN
  Administered 2012-08-25: 150 mg via INTRAVENOUS

## 2012-08-25 MED ORDER — ONDANSETRON HCL 4 MG/2ML IJ SOLN: 4.0000 mg | Freq: Four times a day (QID) | INTRAMUSCULAR | Status: DC | PRN

## 2012-08-25 MED ORDER — NEOSTIGMINE METHYLSULFATE 1 MG/ML IJ SOLN
INTRAMUSCULAR | Status: DC | PRN
  Administered 2012-08-25: 4 mg via INTRAVENOUS

## 2012-08-25 MED ORDER — ONDANSETRON HCL 4 MG/2ML IJ SOLN
INTRAMUSCULAR | Status: DC | PRN
  Administered 2012-08-25: 4 mg via INTRAVENOUS

## 2012-08-25 MED ORDER — HEPARIN SODIUM (PORCINE) 5000 UNIT/ML IJ SOLN
5000.0000 [IU] | INTRAMUSCULAR | Status: AC
  Administered 2012-08-25: 5000 [IU] via SUBCUTANEOUS
  Filled 2012-08-25: qty 1

## 2012-08-25 MED ORDER — SODIUM CHLORIDE 0.9 % IJ SOLN: 3.0000 mL | INTRAMUSCULAR | Status: DC | PRN

## 2012-08-25 MED ORDER — BUPIVACAINE-EPINEPHRINE 0.5% -1:200000 IJ SOLN
INTRAMUSCULAR | Status: AC
  Filled 2012-08-25: qty 1

## 2012-08-25 MED ORDER — HYDROMORPHONE HCL PF 1 MG/ML IJ SOLN
0.2500 mg | INTRAMUSCULAR | Status: DC | PRN
  Administered 2012-08-25: 0.5 mg via INTRAVENOUS
  Administered 2012-08-25 (×2): 0.25 mg via INTRAVENOUS

## 2012-08-25 MED ORDER — ACETAMINOPHEN 325 MG PO TABS: 650.0000 mg | ORAL_TABLET | ORAL | Status: DC | PRN

## 2012-08-25 MED ORDER — OXYCODONE-ACETAMINOPHEN 5-325 MG PO TABS: 1.0000 | ORAL_TABLET | ORAL | Status: DC | PRN

## 2012-08-25 MED ORDER — LIDOCAINE HCL (CARDIAC) 20 MG/ML IV SOLN
INTRAVENOUS | Status: DC | PRN
  Administered 2012-08-25: 50 mg via INTRAVENOUS

## 2012-08-25 SURGICAL SUPPLY — 29 items
BENZOIN TINCTURE PRP APPL 2/3 (GAUZE/BANDAGES/DRESSINGS) IMPLANT
BLADE HEX COATED 2.75 (ELECTRODE) ×2 IMPLANT
CANISTER SUCTION 2500CC (MISCELLANEOUS) ×2 IMPLANT
CLOTH BEACON ORANGE TIMEOUT ST (SAFETY) ×2 IMPLANT
DECANTER SPIKE VIAL GLASS SM (MISCELLANEOUS) ×4 IMPLANT
DERMABOND ADVANCED (GAUZE/BANDAGES/DRESSINGS) ×1
DERMABOND ADVANCED .7 DNX12 (GAUZE/BANDAGES/DRESSINGS) ×1 IMPLANT
DRAPE LAPAROSCOPIC ABDOMINAL (DRAPES) ×2 IMPLANT
ELECT REM PT RETURN 9FT ADLT (ELECTROSURGICAL) ×2
ELECTRODE REM PT RTRN 9FT ADLT (ELECTROSURGICAL) ×1 IMPLANT
GLOVE BIOGEL M 8.0 STRL (GLOVE) ×2 IMPLANT
GOWN STRL NON-REIN LRG LVL3 (GOWN DISPOSABLE) IMPLANT
GOWN STRL REIN XL XLG (GOWN DISPOSABLE) ×4 IMPLANT
KIT ACCESS PORT VG (Band) ×2 IMPLANT
KIT BASIN OR (CUSTOM PROCEDURE TRAY) ×2 IMPLANT
MESH HERNIA 1X4 RECT BARD (Mesh General) ×1 IMPLANT
MESH HERNIA BARD 1X4 (Mesh General) ×1 IMPLANT
NEEDLE HYPO 22GX1.5 SAFETY (NEEDLE) ×2 IMPLANT
NS IRRIG 1000ML POUR BTL (IV SOLUTION) ×2 IMPLANT
PACK GENERAL/GYN (CUSTOM PROCEDURE TRAY) ×2 IMPLANT
STAPLER VISISTAT 35W (STAPLE) ×2 IMPLANT
STRIP CLOSURE SKIN 1/2X4 (GAUZE/BANDAGES/DRESSINGS) IMPLANT
SUT PROLENE 2 0 CT2 30 (SUTURE) ×2 IMPLANT
SUT VIC AB 2-0 SH 27 (SUTURE)
SUT VIC AB 2-0 SH 27X BRD (SUTURE) IMPLANT
SUT VIC AB 4-0 SH 18 (SUTURE) ×2 IMPLANT
SYR 30ML LL (SYRINGE) ×2 IMPLANT
SYR BULB IRRIGATION 50ML (SYRINGE) ×2 IMPLANT
SYRINGE 10CC LL (SYRINGE) ×2 IMPLANT

## 2012-08-25 NOTE — Transfer of Care (Signed)
Immediate Anesthesia Transfer of Care Note  Patient: Deanna Edwards  Procedure(s) Performed: Procedure(s) (LRB): GASTRIC BANDING PORT REVISION (N/A)  Patient Location: PACU  Anesthesia Type: General  Level of Consciousness: sedated, patient cooperative and responds to stimulaton  Airway & Oxygen Therapy: Patient Spontanous Breathing and Patient connected to face mask oxgen  Post-op Assessment: Report given to PACU RN and Post -op Vital signs reviewed and stable  Post vital signs: Reviewed and stable  Complications: No apparent anesthesia complications

## 2012-08-25 NOTE — Interval H&P Note (Signed)
History and Physical Interval Note:  08/25/2012 1:02 PM  Deanna Edwards  has presented today for surgery, with the diagnosis of lap band port leak  The various methods of treatment have been discussed with the patient and family. After consideration of risks, benefits and other options for treatment, the patient has consented to  Procedure(s) with comments: GASTRIC BANDING PORT REVISION (N/A) - Lap Band Port Replacement as a surgical intervention .  The patient's history has been reviewed, patient examined, no change in status, stable for surgery.  I have reviewed the patient's chart and labs.  Questions were answered to the patient's satisfaction.     Haeden Hudock B

## 2012-08-25 NOTE — Op Note (Signed)
Surgeon: Wenda Low, MD, FACS  Asst:  none  Anes:  general  Procedure: Replacement of leaking lapband port:  Leaking part identified in the intraabdominal tubing just near but not involved with the junction and on the part of the tube contiguous with the actual port  Diagnosis: Leaking lapband port  Complications: none  EBL:   minimal cc  Description of Procedure:  The patient was taken to room 1 and given general anesthesia.  A time out was performed after prepping with PCMX.  The old incision was reincised and the port and mesh removed.;  The port was injected with saline and active extravasation was seen coming from the tubing in the abdominal wall .  The tubing was pulled out of the abdomen and the hole was found just before the connection.  The tubing was cut proximally and a new port with mesh on the back was connected and tucked into the lateral pocket.  The tubing was threaded back into the abdomen.  The wound was closed with 4-0 vicryl and Dermabond.    Matt B. Daphine Deutscher, MD, [facility], [place] [phone]

## 2012-08-25 NOTE — Discharge Instructions (Signed)
Gastric Banding Surgery Care After Refer to this sheet in the next few weeks. These discharge instructions provide you with general information on caring for yourself after you leave the hospital. Your caregiver may also give you specific instructions. Your treatment has been planned according to the most current medical practices available, but unavoidable complications sometimes occur. If you have any problems or questions after discharge, call your caregiver. HOME CARE INSTRUCTIONS  Activity  Take frequent walks throughout the day. This will help to prevent blood clots. Do not sit for longer than 45 minutes to 1 hour while awake for 4 to 6 weeks after surgery.  Continue to do coughing and deep breathing exercises once you get home. This will help to prevent pneumonia.  Do not do strenuous activities, such as heavy lifting, pushing, or pulling, until after your follow-up visit with your caregiver. Do not lift anything heavier than 10 lb (4.5 kg).  Talk with your caregiver about when you may return to work and your exercise routine.  Do not drive while taking prescription pain medicine. Nutrition  It is very important that you drink at least 80 oz (2,400 mL) of fluid a day.  You should stay on a clear liquid diet until your follow-up visit with your caregiver. Keep sugar-free, clear liquid items on hand, including:  Tea: hot or cold. Drink only decaffeinated for the first month.  Broths: clear beef, chicken, vegetable.  Others: water, sugar-free frozen ice pops, flavored water, gelatin (after 1 week).  Do not consume caffeine for 1 month. Large amounts of caffeine can cause dehydration.  A dietician may also give you specific instructions.  Follow your caregiver's recommendations about vitamins and protein requirements after surgery. Hygiene  You may shower and wash your hair 2 days after surgery. Pat incisions dry. Do not rub incisions with a washcloth or towel.  Follow your  caregiver's recommendations about baths and pools following surgery. Pain control  If a prescription medicine was given, follow your caregiver's directions.  You may feel some gas pain caused by the carbon dioxide used to inflate your abdomen during surgery. This pain can be felt in your chest, shoulder, back, or abdominal area. Moving around often is advised. Incision care You may have 4 or more small incisions. They are closed with skin adhesive strips and have a clear plastic covering over them. You may remove your dressings the number of days directed by your caregiver after surgery. Check your incisions and surrounding area daily for any redness, swelling, discoloration, fluid (drainage), or bleeding. Dark red, dried blood may appear under these coverings. This is normal. SEEK MEDICAL CARE IF:   You develop persistent nausea and vomiting.  You have pain and discomfort with swallowing.  You have pain, swelling, or warmth in the lower extremities.  You have an oral temperature above 102 F (38.9 C).  You develop chills.  Your incision sites look red, swollen, or have drainage.  Your stool is black, tarry, or maroon in color.  You are lightheaded when standing.  You have any questions or concerns. SEEK IMMEDIATE MEDICAL CARE IF:   You have chest pain.  You have severe calf pain or pain not relieved by medicine.  You develop shortness of breath or difficulty breathing.  You feel confused.  You have slurred speech.  You suddenly feel weak. MAKE SURE YOU:   Understand these instructions.  Will watch your condition.  Will get help right away if you are not doing well or get   worse. Document Released: 09/12/2003 Document Revised: 04/22/2011 Document Reviewed: 06/20/2009 ExitCare Patient Information 2014 [employer].  

## 2012-08-25 NOTE — Anesthesia Postprocedure Evaluation (Signed)
Anesthesia Post Note  Patient: Deanna Edwards  Procedure(s) Performed: Procedure(s) (LRB): GASTRIC BANDING PORT REVISION (N/A)  Anesthesia type: General  Patient location: PACU  Post pain: Pain level controlled  Post assessment: Post-op Vital signs reviewed  Last Vitals:  Filed Vitals:   08/25/12 1500  BP: 139/70  Pulse: 69  Temp:   Resp: 22    Post vital signs: Reviewed  Level of consciousness: sedated  Complications: No apparent anesthesia complications

## 2012-08-25 NOTE — H&P (Signed)
Chief Complaint: Leaking lapband port  History of Present Illness: Deanna Edwards is an 52 y.o. female who has had no restriction since February. This occurred rather suddenly. Mardelle Matte and I both have access her port and the fluid goes out immediately as if the tubing is not intact. Xray showed the tubing to be in continuity but this cannot rule out a lacerated or tear at the port tubing junction  Past Medical History   Diagnosis  Date   .  Hypertension     Past Surgical History   Procedure  Laterality  Date   .  Hernia repair     .  Laparoscopic gastric banding      Current Outpatient Prescriptions   Medication  Sig  Dispense  Refill   .  azithromycin (ZITHROMAX) 250 MG tablet      .  chlorhexidine (PERIDEX) 0.12 % solution      .  HYDROcodone-acetaminophen (NORCO/VICODIN) 5-325 MG per tablet      .  predniSONE (DELTASONE) 20 MG tablet       No current facility-administered medications for this visit.   Codeine and Sulfa antibiotics  Family History   Problem  Relation  Age of Onset   .  Hypertension  Father    .  Emphysema  Father    .  Asthma  Father    .  Hypertension  Sister    .  Cancer  Maternal Grandmother      Breast   Social History: reports that she quit smoking about 17 years ago. Her smoking use included Cigarettes. She smoked 0.00 packs per day. She has never used smokeless tobacco. She reports that she does not drink alcohol or use illicit drugs.  REVIEW OF SYSTEMS - PERTINENT POSITIVES ONLY:  No changes  Physical Exam:  Blood pressure 160/82, pulse 68, temperature 98.6 F (37 C), temperature source Temporal, resp. rate 16, height 5\' 2"  (1.575 m), weight 173 lb 12.8 oz (78.835 kg), last menstrual period 06/25/2012.  Body mass index is 31.78 kg/(m^2).  Gen: WDWN WF NAD  Neurological: Alert and oriented to person, place, and time. Motor and sensory function is grossly intact  Head: Normocephalic and atraumatic.  Eyes: Conjunctivae are normal. Pupils are equal, round, and  reactive to light. No scleral icterus.  Neck: Normal range of motion. Neck supple. No tracheal deviation or thyromegaly present.  Cardiovascular: SR without murmurs or gallops. No carotid bruits  Respiratory: Effort normal. No respiratory distress. No chest wall tenderness. Breath sounds normal. No wheezes, rales or rhonchi.  Abdomen: Port easily accessed and 5 cc injected and was not retrievible  GU:  Musculoskeletal: Normal range of motion. Extremities are nontender. No cyanosis, edema or clubbing noted Lymphadenopathy: No cervical, preauricular, postauricular or axillary adenopathy is present Skin: Skin is warm and dry. No rash noted. No diaphoresis. No erythema. No pallor. Pscyh: Normal mood and affect. Behavior is normal. Judgment and thought content normal.  LABORATORY RESULTS:  No results found for this or any previous visit (from the past 48 hour(s)).  RADIOLOGY RESULTS:  No results found.  Problem List:  There are no active problems to display for this patient.  Assessment & Plan:  Leaking lapband port.  Replacement of lapband port   Matt B. Daphine Deutscher, MD, [facility]  [phone] beeper  [phone]

## 2012-08-25 NOTE — Anesthesia Preprocedure Evaluation (Signed)
Anesthesia Evaluation  Patient identified by MRN, date of birth, ID band Patient awake    Reviewed: Allergy & Precautions, H&P , NPO status , Patient's Chart, lab work & pertinent test results  Airway Mallampati: II TM Distance: >3 FB Neck ROM: Full    Dental  (+) Edentulous Upper, Partial Lower and Dental Advisory Given   Pulmonary neg pulmonary ROS, neg sleep apnea,  breath sounds clear to auscultation  Pulmonary exam normal       Cardiovascular negative cardio ROS  Rhythm:Regular Rate:Normal     Neuro/Psych negative neurological ROS  negative psych ROS   GI/Hepatic Neg liver ROS, GERD-  ,  Endo/Other  negative endocrine ROS  Renal/GU negative Renal ROS  negative genitourinary   Musculoskeletal negative musculoskeletal ROS (+)   Abdominal   Peds  Hematology negative hematology ROS (+)   Anesthesia Other Findings   Reproductive/Obstetrics                           Anesthesia Physical Anesthesia Plan  ASA: II  Anesthesia Plan: General   Post-op Pain Management:    Induction: Intravenous  Airway Management Planned: Oral ETT  Additional Equipment:   Intra-op Plan:   Post-operative Plan: Extubation in OR  Informed Consent: I have reviewed the patients History and Physical, chart, labs and discussed the procedure including the risks, benefits and alternatives for the proposed anesthesia with the patient or authorized representative who has indicated his/her understanding and acceptance.   Dental advisory given  Plan Discussed with: CRNA  Anesthesia Plan Comments:         Anesthesia Quick Evaluation

## 2012-08-26 ENCOUNTER — Encounter (HOSPITAL_COMMUNITY): Payer: Self-pay | Admitting: Surgery

## 2012-09-16 ENCOUNTER — Ambulatory Visit (INDEPENDENT_AMBULATORY_CARE_PROVIDER_SITE_OTHER): Payer: BC Managed Care – PPO | Admitting: Surgery

## 2012-09-16 ENCOUNTER — Encounter (INDEPENDENT_AMBULATORY_CARE_PROVIDER_SITE_OTHER): Payer: Self-pay | Admitting: Surgery

## 2012-09-16 VITALS — BP 136/82 | HR 72 | Temp 98.7°F | Resp 16 | Wt 187.0 lb

## 2012-09-16 DIAGNOSIS — Z9884 Bariatric surgery status: Secondary | ICD-10-CM

## 2012-09-16 NOTE — Progress Notes (Signed)
Deanna Edwards 52 y.o.  Body mass index is 34.19 kg/(m^2).  Patient Active Problem List   Diagnosis Date Noted  . 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 Codeine Nausea And Vomiting  . Sulfa Antibiotics Other (See Comments)    "drug fever" per her doctor.    Past Surgical History  Procedure Laterality Date  . Hernia repair    . Laparoscopic gastric banding      hiatal hernia repair also  . Tubal ligation    . Cervical conization w/bx    . Gastric banding port revision N/A 08/25/2012    Procedure: GASTRIC BANDING PORT REVISION;  Surgeon: Valarie Merino, MD;  Location: [facility];  Service: General;  Laterality: N/A;  Lap Band Port Replacement   Ginette Otto, MD No diagnosis found.  Doing well after port revision.  2 cc remained.  Added to a total of 6 cc fill volume.   Return 4 weeks Matt B. Daphine Deutscher, MD, [facility] [phone] beeper [phone]  09/16/2012 9:06 AM

## 2012-09-16 NOTE — Patient Instructions (Signed)

## 2012-09-18 ENCOUNTER — Ambulatory Visit (INDEPENDENT_AMBULATORY_CARE_PROVIDER_SITE_OTHER): Payer: BC Managed Care – PPO | Admitting: Surgery

## 2012-09-18 ENCOUNTER — Encounter (INDEPENDENT_AMBULATORY_CARE_PROVIDER_SITE_OTHER): Payer: Self-pay | Admitting: Surgery

## 2012-09-18 VITALS — BP 158/90 | HR 86 | Resp 16 | Ht 62.0 in | Wt 176.6 lb

## 2012-09-18 DIAGNOSIS — Z9884 Bariatric surgery status: Secondary | ICD-10-CM

## 2012-09-18 NOTE — Patient Instructions (Signed)
Thanks for your patience.  If you need further assistance after leaving the office, please call our office and speak with a [facility] nurse.  [phone].  If you want to leave a message for Dr. Ivery Nanney, please call his office phone at [phone]. 

## 2012-09-18 NOTE — Progress Notes (Signed)
Lapband Fill Encounter Problem List:   Patient Active Problem List   Diagnosis Date Noted  . 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 Weight loss since surgery  Overfilled   Having regurgitation?:  yes  Feel that they need a fill?  yes  Nocturnal reflux?  y  Amount of fill  -2     Instructions given and weight loss goals discussed.    4 cc left in band.  Drinking OK  Matt B. Daphine Deutscher, MD, FACS

## 2012-10-14 ENCOUNTER — Encounter (INDEPENDENT_AMBULATORY_CARE_PROVIDER_SITE_OTHER): Payer: BC Managed Care – PPO | Admitting: Surgery

## 2012-11-10 ENCOUNTER — Encounter (INDEPENDENT_AMBULATORY_CARE_PROVIDER_SITE_OTHER): Payer: Self-pay

## 2012-11-27 ENCOUNTER — Other Ambulatory Visit (INDEPENDENT_AMBULATORY_CARE_PROVIDER_SITE_OTHER): Payer: Self-pay

## 2012-11-27 ENCOUNTER — Ambulatory Visit (INDEPENDENT_AMBULATORY_CARE_PROVIDER_SITE_OTHER): Payer: BC Managed Care – PPO | Admitting: Surgery

## 2012-11-27 ENCOUNTER — Encounter (INDEPENDENT_AMBULATORY_CARE_PROVIDER_SITE_OTHER): Payer: Self-pay | Admitting: Surgery

## 2012-11-27 VITALS — BP 130/90 | HR 92 | Temp 98.6°F | Resp 15 | Ht 62.0 in | Wt 177.4 lb

## 2012-11-27 DIAGNOSIS — Z4651 Encounter for fitting and adjustment of gastric lap band: Secondary | ICD-10-CM

## 2012-11-27 DIAGNOSIS — Z9884 Bariatric surgery status: Secondary | ICD-10-CM

## 2012-11-27 NOTE — Progress Notes (Signed)
Lapband Fill Encounter Problem List:   Patient Active Problem List   Diagnosis Date Noted  . 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 Weight loss since surgery  70  Having regurgitation?:  no  Feel that they need a fill?  yes  Nocturnal reflux?  no  Amount of fill  +0.35     Instructions given and weight loss goals discussed.    Holding her weight since last visit.  Not feeling much restriction.  Will add a small amount.   Will get appt with [facility] Dec as she is not remembering what foods  she should avoid.  Will see Mardelle Matte back in 6 weeks.   Matt B. Daphine Deutscher, MD, FACS

## 2012-11-27 NOTE — Patient Instructions (Signed)

## 2012-12-31 ENCOUNTER — Encounter (INDEPENDENT_AMBULATORY_CARE_PROVIDER_SITE_OTHER): Payer: BC Managed Care – PPO

## 2013-01-21 ENCOUNTER — Encounter (INDEPENDENT_AMBULATORY_CARE_PROVIDER_SITE_OTHER): Payer: BC Managed Care – PPO

## 2013-02-25 ENCOUNTER — Ambulatory Visit (INDEPENDENT_AMBULATORY_CARE_PROVIDER_SITE_OTHER): Payer: BC Managed Care – PPO | Admitting: Physician Assistant

## 2013-02-25 ENCOUNTER — Encounter (INDEPENDENT_AMBULATORY_CARE_PROVIDER_SITE_OTHER): Payer: Self-pay

## 2013-02-25 VITALS — BP 158/92 | HR 72 | Temp 98.0°F | Resp 14 | Ht 62.0 in | Wt 162.2 lb

## 2013-02-25 DIAGNOSIS — Z9884 Bariatric surgery status: Secondary | ICD-10-CM

## 2013-02-25 NOTE — Progress Notes (Signed)
  HISTORY: Deanna Edwards is a 53 y.o.female who received an AP-Large lap-band in October 2010 with a port revision in July 2014 by Dr. Daphine DeutscherMartin. She comes in with 15 lbs weight loss since her last visit in October. Her BMI is down to 29 and she's lost a total of 84 lbs since surgery. She reports being in the green zone as she's continuing to eat small portions and her hunger is well-controlled. She denies regurgitation or reflux symptoms.Marland Kitchen.  VITAL SIGNS: Filed Vitals:   02/25/13 1039  BP: 158/92  Pulse: 72  Temp: 98 F (36.7 C)  Resp: 14    PHYSICAL EXAM: Physical exam reveals a very well-appearing 53 y.o.female in no apparent distress Neurologic: Awake, alert, oriented Psych: Bright affect, conversant Respiratory: Breathing even and unlabored. No stridor or wheezing Extremities: Atraumatic, good range of motion. Skin: Warm, Dry, no rashes Musculoskeletal: Normal gait, Joints normal  ASSESMENT: 53 y.o.  female  s/p AP-Large lap-band.   PLAN: She believes the band is in good position today with regard to fill volume. She doesn't want a fill. She is looking toward improving her diet by making better choices and increasing her exercise. We'll have her back in three months or sooner if needed.

## 2013-02-25 NOTE — Patient Instructions (Signed)
Return in three months. Focus on good food choices as well as physical activity. Return sooner if you have an increase in hunger, portion sizes or weight. Return also for difficulty swallowing, night cough, reflux.   

## 2013-03-17 ENCOUNTER — Other Ambulatory Visit: Payer: Self-pay | Admitting: Obstetrics and Gynecology

## 2013-04-05 ENCOUNTER — Other Ambulatory Visit: Payer: Self-pay

## 2013-05-27 ENCOUNTER — Encounter (INDEPENDENT_AMBULATORY_CARE_PROVIDER_SITE_OTHER): Payer: BC Managed Care – PPO

## 2015-02-24 ENCOUNTER — Ambulatory Visit
Admission: RE | Admit: 2015-02-24 | Discharge: 2015-02-24 | Disposition: A | Discharge status: Home / Self Care | Payer: 59 | Source: Ambulatory Visit | Attending: Surgery | Admitting: Surgery

## 2015-02-24 ENCOUNTER — Other Ambulatory Visit: Payer: Self-pay | Admitting: Surgery

## 2015-02-24 DIAGNOSIS — Z4651 Encounter for fitting and adjustment of gastric lap band: Secondary | ICD-10-CM

## 2015-03-22 ENCOUNTER — Other Ambulatory Visit: Payer: Self-pay | Admitting: Surgery

## 2015-03-22 DIAGNOSIS — K9509 Other complications of gastric band procedure: Secondary | ICD-10-CM

## 2015-03-23 ENCOUNTER — Ambulatory Visit
Admission: RE | Admit: 2015-03-23 | Discharge: 2015-03-23 | Disposition: A | Discharge status: Home / Self Care | Payer: 59 | Source: Ambulatory Visit | Attending: Surgery | Admitting: Surgery

## 2015-03-23 ENCOUNTER — Other Ambulatory Visit: Payer: Self-pay | Admitting: Surgery

## 2015-03-23 DIAGNOSIS — K9509 Other complications of gastric band procedure: Secondary | ICD-10-CM

## 2015-04-21 ENCOUNTER — Emergency Department (HOSPITAL_COMMUNITY)
Admission: EM | Admit: 2015-04-21 | Discharge: 2015-04-21 | Disposition: A | Discharge status: Home / Self Care | Payer: 59 | Attending: Emergency Medicine | Admitting: Emergency Medicine

## 2015-04-21 ENCOUNTER — Encounter (HOSPITAL_COMMUNITY): Payer: Self-pay | Admitting: Emergency Medicine

## 2015-04-21 ENCOUNTER — Emergency Department (HOSPITAL_COMMUNITY): Payer: 59

## 2015-04-21 DIAGNOSIS — Z79899 Other long term (current) drug therapy: Secondary | ICD-10-CM | POA: Diagnosis not present

## 2015-04-21 DIAGNOSIS — X58XXXA Exposure to other specified factors, initial encounter: Secondary | ICD-10-CM | POA: Insufficient documentation

## 2015-04-21 DIAGNOSIS — Y9289 Other specified places as the place of occurrence of the external cause: Secondary | ICD-10-CM | POA: Insufficient documentation

## 2015-04-21 DIAGNOSIS — M25511 Pain in right shoulder: Secondary | ICD-10-CM | POA: Diagnosis present

## 2015-04-21 DIAGNOSIS — Z8719 Personal history of other diseases of the digestive system: Secondary | ICD-10-CM | POA: Insufficient documentation

## 2015-04-21 DIAGNOSIS — Y9389 Activity, other specified: Secondary | ICD-10-CM | POA: Diagnosis not present

## 2015-04-21 DIAGNOSIS — Z8669 Personal history of other diseases of the nervous system and sense organs: Secondary | ICD-10-CM | POA: Insufficient documentation

## 2015-04-21 DIAGNOSIS — Z87891 Personal history of nicotine dependence: Secondary | ICD-10-CM | POA: Insufficient documentation

## 2015-04-21 DIAGNOSIS — S43401A Unspecified sprain of right shoulder joint, initial encounter: Secondary | ICD-10-CM | POA: Diagnosis not present

## 2015-04-21 DIAGNOSIS — I10 Essential (primary) hypertension: Secondary | ICD-10-CM | POA: Insufficient documentation

## 2015-04-21 DIAGNOSIS — Y998 Other external cause status: Secondary | ICD-10-CM | POA: Insufficient documentation

## 2015-04-21 MED ORDER — METHOCARBAMOL 500 MG PO TABS: 500.0000 mg | ORAL_TABLET | Freq: Two times a day (BID) | ORAL | Status: DC

## 2015-04-21 NOTE — ED Notes (Addendum)
Pt states that she has "had rt shoulder pain for a while" but states that she opened the mailbox a few days ago and made it worse.

## 2015-04-21 NOTE — Discharge Instructions (Signed)
For pain control you may take up to 800mg  of Motrin (also known as ibuprofen). That is usually 4 over the counter pills,  3 times a day. Take with food to minimize stomach irritation   For breakthrough pain you may take Robaxin. Do not drink alcohol, drive or operate heavy machinery when taking Robaxin.  Please follow with your primary care doctor in the next 2 days for a check-up. They must obtain records for further management.   Do not hesitate to return to the Emergency Department for any new, worsening or concerning symptoms.   Shoulder Sprain A shoulder sprain is a partial or complete tear in one of the tough, fiber-like tissues (ligaments) in the shoulder. The ligaments in the shoulder help to hold the shoulder in place. CAUSES This condition may be caused by:  A fall.  A hit to the shoulder.  A twist of the arm. RISK FACTORS This condition is more likely to develop in:  People who play sports.  People who have problems with balance or coordination. SYMPTOMS Symptoms of this condition include:  Pain when moving the shoulder.  Limited ability to move the shoulder.  Swelling and tenderness on top of the shoulder.  Warmth in the shoulder.  A change in the shape of the shoulder.  Redness or bruising on the shoulder. DIAGNOSIS This condition is diagnosed with a physical exam. During the exam, you may be asked to do simple exercises with your shoulder. You may also have imaging tests, such as X-rays, MRI, or a CT scan. These tests can show how severe the sprain is. TREATMENT This condition may be treated with:  Rest.  Pain medicine.  Ice.  A sling or brace. This is used to keep the arm still while the shoulder is healing.  Physical therapy or rehabilitation exercises. These help to improve the range of motion and strength of the shoulder.  Surgery (rare). Surgery may be needed if the sprain caused a joint to become unstable. Surgery may also be needed to reduce  pain. Some people may develop ongoing shoulder pain or lose some range of motion in the shoulder. However, most people do not develop long-term problems. HOME CARE INSTRUCTIONS  Rest.  Take over-the-counter and prescription medicines only as told by your health care provider.  If directed, apply ice to the area:  Put ice in a plastic bag.  Place a towel between your skin and the bag.  Leave the ice on for 20 minutes, 2-3 times per day.  If you were given a shoulder sling or brace:  Wear it as told.  Remove it to shower or bathe.  Move your arm only as much as told by your health care provider, but keep your hand moving to prevent swelling.  If you were shown how to do any exercises, do them as told by your health care provider.  Keep all follow-up visits as told by your health care provider. This is important. SEEK MEDICAL CARE IF:  Your pain gets worse.  Your pain is not relieved with medicines.  You have increased redness or swelling. SEEK IMMEDIATE MEDICAL CARE IF:  You have a fever.  You cannot move your arm or shoulder.  You develop numbness or tingling in your arms, hands, or fingers.   This information is not intended to replace advice given to you by your health care provider. Make sure you discuss any questions you have with your health care provider.   Document Released: 06/16/2008 Document Revised:  10/19/2014 Document Reviewed: 05/23/2014 Elsevier Interactive Patient Education [employer].

## 2015-04-21 NOTE — ED Provider Notes (Signed)
CSN: [id number]     Arrival date & time 04/21/15  1313 History  By signing my name below, I, Emmanuella Mensah, attest that this documentation has been prepared under the direction and in the presence of [employer] Yanci Bachtell, PA-C. Electronically Signed: Angelene GiovanniEmmanuella Mensah, ED Scribe. 04/21/2015. 3:20 PM.    Chief Complaint  Patient presents with  . Shoulder Pain   The history is provided by the patient. No language interpreter was used.   HPI Comments: Deanna Edwards is a 55 y.o. female with a hx of HTN who presents to the Emergency Department complaining of gradually worsening intermittent moderate right posterior shoulder pain onset several months ago. She reports associated intermittent dislocation issues within the right shoulder with movement. She explains that the pain has started to become frequent after she went to open her mailbox 2 days ago. No alleviating factors noted. Pt has not tried any medications for her symptoms. She states that she types a lot at work. She reports an allergy to codeine. No numbness, weakness, or open wounds.    Past Medical History  Diagnosis Date  . GERD (gastroesophageal reflux disease)     no issue since lap band surgery  . Hx of laparoscopic gastric banding 08-21-12     10'10  . Sleep apnea     hx. of-no current problems  . Hypertension     no  meds in several years   Past Surgical History  Procedure Laterality Date  . Hernia repair    . Laparoscopic gastric banding      hiatal hernia repair also  . Tubal ligation    . Cervical conization w/bx    . Gastric banding port revision N/A 08/25/2012    Procedure: GASTRIC BANDING PORT REVISION;  Surgeon: Valarie MerinoMatthew B Martin, MD;  Location: [facility];  Service: General;  Laterality: N/A;  Lap Band Port Replacement   Family History  Problem Relation Age of Onset  . Hypertension Father   . Emphysema Father   . Asthma Father   . Hypertension Sister   . Cancer Maternal Grandmother     Breast   Social History   Substance Use Topics  . Smoking status: Former Smoker    Types: Cigarettes    Quit date: 09/14/1994  . Smokeless tobacco: Never Used  . Alcohol Use: No   OB History    No data available     Review of Systems  10 systems reviewed and found to be negative, except as noted in the HPI.   Allergies  Codeine and Sulfa antibiotics  Home Medications   Prior to Admission medications   Medication Sig Start Date End Date Taking? Authorizing Provider  Multiple Vitamin (MULTIVITAMIN WITH MINERALS) TABS Take 1 tablet by mouth daily.    Historical Provider, MD   BP 145/83 mmHg  Pulse 98  Temp(Src) 97.8 F (36.6 C) (Oral)  Resp 16  SpO2 100% Physical Exam  Constitutional: She is oriented to person, place, and time. She appears well-developed and well-nourished. No distress.  HENT:  Head: Normocephalic and atraumatic.  Mouth/Throat: Oropharynx is clear and moist.  Eyes: Conjunctivae and EOM are normal. Pupils are equal, round, and reactive to light.  Neck: Normal range of motion.  Cardiovascular: Normal rate, regular rhythm and intact distal pulses.   Pulmonary/Chest: Effort normal and breath sounds normal. No respiratory distress. She has no wheezes. She has no rales. She exhibits no tenderness.  Abdominal: Soft. She exhibits no distension. There is no tenderness.  Musculoskeletal: Normal  range of motion.       Back:  Right shoulder: Shoulder with no deformity. FROM to shoulder and elbow. +TTP. Drop arm negative. Neurovascularly intact   Neurological: She is alert and oriented to person, place, and time.  Skin: Skin is warm and dry. She is not diaphoretic.  Psychiatric: She has a normal mood and affect.  Nursing note and vitals reviewed.   ED Course  Procedures (including critical care time) DIAGNOSTIC STUDIES: Oxygen Saturation is 100% on RA, normal by my interpretation.    COORDINATION OF CARE: 3:19 PM- Pt advised of plan for treatment and pt agrees. Pt informed of her  x-ray results. She will receive Ibuprofen and muscle relaxants. Advised to rest, alternate ice/heat, and to avoid repetitive motions with her right arm. Pt declines arm sling. Will provide resources to follow up with Ortho for possible rotator cuff issues. Pt will receive work note for today and tomorrow.    Imaging Review Dg Shoulder Right  04/21/2015  CLINICAL DATA:  Chronic right shoulder pain with recent injury a few days ago associated with sensation of shoulder locking EXAM: RIGHT SHOULDER - 2+ VIEW COMPARISON:  None. FINDINGS: There is mild acromioclavicular joint hypertrophy. There are a few tiny subacromial ossicles. The glenohumeral joint demonstrates no significant degenerative change. There is no evidence of proximal humerus fracture or dislocation. IMPRESSION: No acute findings Electronically Signed   By: Esperanza Heir M.D.   On: 04/21/2015 13:40   Wynetta Emery, PA-C has personally reviewed and evaluated these images as part of her medical decision-making.   MDM   Final diagnoses:  Shoulder sprain, right, initial encounter    Filed Vitals:   04/21/15 1311  BP: 145/83  Pulse: 98  Temp: 97.8 F (36.6 C)  TempSrc: Oral  Resp: 16  SpO2: 100%    Deanna Edwards is 55 y.o. female presenting with shoulder pain which she describes as a catching. Excellent range of motion, distally neurovascular intact, x-rays negative, possible rotator cuff issue. Patient declines sling. Will give her orthopedic referral.  Evaluation does not show pathology that would require ongoing emergent intervention or inpatient treatment. Pt is hemodynamically stable and mentating appropriately. Discussed findings and plan with patient/guardian, who agrees with care plan. All questions answered. Return precautions discussed and outpatient follow up given.   New Prescriptions   METHOCARBAMOL (ROBAXIN) 500 MG TABLET    Take 1 tablet (500 mg total) by mouth 2 (two) times daily.     I personally  performed the services described in this documentation, which was scribed in my presence. The recorded information has been reviewed and is accurate.   Wynetta Emery, PA-C 04/21/15 1533  Marily Memos, MD 04/21/15 1601

## 2015-05-01 ENCOUNTER — Other Ambulatory Visit: Payer: Self-pay | Admitting: Obstetrics & Gynecology

## 2015-05-02 LAB — CYTOLOGY - PAP

## 2015-05-30 ENCOUNTER — Other Ambulatory Visit (HOSPITAL_COMMUNITY): Payer: Self-pay | Admitting: Surgery

## 2015-05-30 DIAGNOSIS — K9509 Other complications of gastric band procedure: Secondary | ICD-10-CM

## 2015-06-05 ENCOUNTER — Encounter: Payer: 59 | Attending: Surgery | Admitting: Skilled Nursing Facility1

## 2015-06-05 ENCOUNTER — Ambulatory Visit (HOSPITAL_COMMUNITY): Payer: 59

## 2015-06-05 ENCOUNTER — Encounter: Payer: Self-pay | Admitting: Skilled Nursing Facility1

## 2015-06-05 VITALS — Ht 62.0 in | Wt 217.0 lb

## 2015-06-05 DIAGNOSIS — E669 Obesity, unspecified: Secondary | ICD-10-CM

## 2015-06-05 DIAGNOSIS — K9509 Other complications of gastric band procedure: Secondary | ICD-10-CM | POA: Insufficient documentation

## 2015-06-05 NOTE — Patient Instructions (Signed)
Follow Pre-Op Goals Try Protein Shakes Call [facility] at [phone] when surgery is scheduled to enroll in Pre-Op Class  Things to remember:  Please always be honest with us. We want to support you!  If you have any questions or concerns in between appointments, please call or email Liz, Leslie, or Laurie.  The diet after surgery will be high protein and low in carbohydrate.  Vitamins and calcium need to be taken for the rest of your life.  Feel free to include support people in any classes or appointments.   Supplement recommendations:  Complete" Multivitamin: Sleeve Gastrectomy and RYGB patients take a double dose of MVI. LAGB patients take single dose as it is written on the package. Vitamin must be liquid or chewable but not gummy. Examples of these include Flintstones Complete and Centrum Complete. If the vitamin is bariatric-specific, take 1 dose as it is already formulated for bariatric surgery patients. Examples of these are Bariatric Advantage, Celebrate, and Wellesse. These can be found at the [facility] and/or online.     Calcium citrate: 1500 mg/day of Calcium citrate (also chewable or liquid) is recommended for all procedures. The body is only able to absorb 500-600 mg of Calcium at one time so 3 daily doses of 500 mg are recommended. Calcium doses must be taken a minimum of 2 hours apart. Additionally, Calcium must be taken 2 hours apart from iron-containing MVI. Examples of brands include Celebrate, Bariatric Advantage, and Wellesse. These brands must be purchased online or at the [facility]. Citracal Petites is the only Calcium citrate supplement found in general grocery stores and pharmacies. This is in tablet form and may be recommended for patients who do not tolerate chewable Calcium.  Continued or added Vitamin D supplementation based on individual needs.    Vitamin B12: 300-500 mcg/day for Sleeve Gastrectomy and RYGB. Optional for  LAGB patients as stomach remains fully intact. Must be taken intramuscularly, sublingually, or inhaled nasally. Oral route is not recommended. 

## 2015-06-05 NOTE — Progress Notes (Signed)
  Pre-Op Assessment Visit:  Pre-Operative Sleeve Gastrectomy Surgery  Medical Nutrition Therapy:  Appt start time: 1400   End time:  1500.  Patient was seen on 06/05/2015 for Pre-Operative Nutrition Assessment. Assessment and letter of approval [facility] on 06/05/2015.   Pt states she has had the lapband in 2010 which was successful but then fall 2016 she gained 40 pounds due to increased appetite. Pt plays the Ukulele!   Preferred Learning Style:   No preference indicated   Learning Readiness:   Change in progress  Handouts given during visit include:  Pre-Op Goals Bariatric Surgery Protein Shakes   During the appointment today the following Pre-Op Goals were reviewed with the patient: Maintain or lose weight as instructed by your surgeon Make healthy food choices Begin to limit portion sizes Limited concentrated sugars and fried foods Keep fat/sugar in the single digits per serving on   food labels Practice CHEWING your food  (aim for 30 chews per bite or until applesauce consistency) Practice not drinking 15 minutes before, during, and 30 minutes after each meal/snack Avoid all carbonated beverages  Avoid/limit caffeinated beverages  Avoid all sugar-sweetened beverages Consume 3 meals per day; eat every 3-5 hours Make a list of non-food related activities Aim for 64-100 ounces of FLUID daily  Aim for at least 60-80 grams of PROTEIN daily Look for a liquid protein source that contain ?15 g protein and ?5 g carbohydrate  (ex: shakes, drinks, shots)  Patient-Centered Goals: 10/10 specific/non-scale and confidence/importance scale 1-10  Demonstrated degree of understanding via:  Teach Back  Teaching Method Utilized:  Visual Auditory Hands on  Barriers to learning/adherence to lifestyle change: previously perceived failure  [facility] to [facility] to enroll in Pre-Op and  Post-Op Nutrition Education when surgery date is scheduled.

## 2015-07-03 ENCOUNTER — Encounter: Payer: Self-pay | Admitting: Skilled Nursing Facility1

## 2015-07-03 ENCOUNTER — Encounter: Payer: 59 | Attending: Surgery | Admitting: Skilled Nursing Facility1

## 2015-07-03 VITALS — Ht 62.0 in | Wt 223.0 lb

## 2015-07-03 DIAGNOSIS — E669 Obesity, unspecified: Secondary | ICD-10-CM

## 2015-07-03 DIAGNOSIS — K9509 Other complications of gastric band procedure: Secondary | ICD-10-CM | POA: Diagnosis present

## 2015-07-03 NOTE — Progress Notes (Signed)
  Medical Nutrition Therapy:  Appt start time: 0830 end time:  0845.  Assessment:  Primary concerns today: sleeve gastrectomy. Pt returns for her 2nd SWL appointment having gained 6 pounds. Pt states her medication is causing a lot of fluid retention. There was noticable bilateral lower leg edema. Pt states her sister was just diagnosed with cancer so she has been caring for her and eating more fast food than she normally does due to time and emotional coping. Pt states she has been calling [employer] [facility] insurance coordinator to determine what she has to do but she has not gotten a  Return call.   Pts wt: 223 lbs BMI: 40.9  MEDICATIONS: See List  Recent physical activity: Not discussed  Progress Towards Goal(s):  In progress.   Nutritional Diagnosis:  [place]-3.3 Overweight/obesity related to past poor dietary habits and physical inactivity as evidenced by patient w/ referral for sleeve gastrectomy surgery following dietary guidelines for continued weight loss.     Intervention:  Nutrition counseling for obesity. Continue on with preop goals.  Monitoring/Evaluation:  Dietary intake, exercise, and body weight in 1 month(s).

## 2015-08-02 ENCOUNTER — Encounter: Payer: 59 | Attending: Surgery | Admitting: Dietician

## 2015-08-02 ENCOUNTER — Encounter: Payer: Self-pay | Admitting: Dietician

## 2015-08-02 DIAGNOSIS — K9509 Other complications of gastric band procedure: Secondary | ICD-10-CM | POA: Insufficient documentation

## 2015-08-02 NOTE — Progress Notes (Signed)
  Medical Nutrition Therapy:  Appt start time: 1000 end time:  1015.  Supervised weight loss:  Primary concerns today: Deanna Edwards returns having gained 7 pounds. She continues to struggle with significant fluid retention. She states she was taken off of hormones this in hopes of reducing fluid retention. She plans to follow up with her PCP to discuss edema. Noticeable worsening edema. She states that she is very overwhelmed by the pre op goals and what she should be eating. She plans to start tracking foods. She lives with her husband and cares for her son with Aspergers and her mother with dementia. She has removed snacks from her home office. When she does have a snack she pre-portions it. Has also bought smaller plates and has cut out evening snack.   Pts wt: 230.3 lbs BMI: 42.2  MEDICATIONS: See List  B (8:30-9 AM): high protein pancakes with sugar free syrup OR Kashi cereal with [place] yogurt L (1-2 PM): leftover salmon, vegetables, and rice Snk: pre portioned chips or crackers D: vegetable beef stew with rice OR wheat spaghetti with meat sauce  Recent physical activity: Not discussed  Handouts provided: Pre op diet  Progress Towards Goal(s):  In progress.   Nutritional Diagnosis:  [place]-3.3 Overweight/obesity related to past poor dietary habits and physical inactivity as evidenced by patient w/ referral for sleeve gastrectomy surgery following dietary guidelines for continued weight loss.     Intervention:  Nutrition counseling for obesity. Continue on with preop goals.  Monitoring/Evaluation:  Dietary intake, exercise, and body weight in 1 month(s).

## 2015-08-02 NOTE — Patient Instructions (Addendum)
-  Remove your trigger foods from the house and stock up on healthy snacks  -string cheese, boiled eggs, preportioned nuts

## 2015-08-17 ENCOUNTER — Ambulatory Visit
Admission: RE | Admit: 2015-08-17 | Discharge: 2015-08-17 | Disposition: A | Discharge status: Home / Self Care | Payer: 59 | Source: Ambulatory Visit | Attending: Family Medicine | Admitting: Family Medicine

## 2015-08-17 ENCOUNTER — Other Ambulatory Visit: Payer: Self-pay | Admitting: Family Medicine

## 2015-08-17 DIAGNOSIS — R0602 Shortness of breath: Secondary | ICD-10-CM

## 2015-08-30 ENCOUNTER — Encounter: Payer: 59 | Attending: Surgery | Admitting: Dietician

## 2015-08-30 ENCOUNTER — Encounter: Payer: Self-pay | Admitting: Dietician

## 2015-08-30 DIAGNOSIS — K9509 Other complications of gastric band procedure: Secondary | ICD-10-CM | POA: Diagnosis present

## 2015-08-30 NOTE — Progress Notes (Signed)
  Medical Nutrition Therapy:  Appt start time: 1040 end time:  1055.  SWL visit 5 (Patient states she began consecutive visits with physician in March 2017): Deanna Edwards returns having lost 2 pounds. Her physician recently added Lasix and patient feels like this is helping with swelling in lower extremities. Still experiencing edema but states it is improving. Patient reports feeling depressed about her weight. She is discouraged that she was not able to continue to maintain her lifestyle with her Lap Band due to complications with the band. She makes sure to get 64 oz of water per day and sometimes gets more fluid. She continues to work on limiting snack foods.  Weight: 228.8 lbs BMI: 41.9  MEDICATIONS: See List  B (8:30-9 AM): high protein pancakes with sugar free syrup OR Kashi cereal with [place] yogurt L (1-2 PM): leftover salmon, vegetables, and rice Snk: pre portioned chips or crackers D: vegetable beef stew with rice OR wheat spaghetti with meat sauce  Recent physical activity: Not discussed  Handouts provided: none  Progress Towards Goal(s):  In progress.   Nutritional Diagnosis:  [place]-3.3 Overweight/obesity related to past poor dietary habits and physical inactivity as evidenced by patient w/ referral for sleeve gastrectomy surgery following dietary guidelines for continued weight loss.     Intervention:  Nutrition counseling for obesity. Eat protein foods first, then vegetables, then starches if there is room.   Monitoring/Evaluation:  Dietary intake, exercise, and body weight in 1 month(s).

## 2015-08-30 NOTE — Patient Instructions (Addendum)
-  Remove your trigger foods from the house and stock up on healthy snacks  -string cheese, boiled eggs, preportioned nuts  -Eat protein foods first, then vegetables, then starch if there's room

## 2015-09-07 ENCOUNTER — Ambulatory Visit: Payer: Self-pay | Admitting: Surgery

## 2015-09-07 ENCOUNTER — Encounter: Payer: Self-pay | Admitting: Surgery

## 2015-09-07 NOTE — H&P (Signed)
Deanna Edwards Person 09/07/2015 10:23 AM Location: [facility] Patient #: [id number] DOB: 1961/02/01 Married / Language: [place] / Race: White Female   History of Present Illness Deanna Edwards B. Daphine Deutscher MD; 09/07/2015 11:14 AM) The patient is a 55 year old female who presents for a bariatric surgery evaluation. Past treatment has included previous bariatric surgery (Had Lapband APL and [facility] repair Oct 2010). She has completed greater than 6 months of supervized weight loss. She has been unable to be readjusted to achieve good weight loss and wants to convert to a sleeve gastrectomy. I have discussed this with including the possibility of having to do this as a staged procedure. She does not have any ostensible signs of a lap band erosion. At went over sleeve gastrectomy in some detail including perforations and leaks and bleeds. She is aware of the risks and benefits. She would like he would undergo this removal of her lap band and conversion to a sleeve gastrectomy.   Medication History (Alisha Spillers, CMA; 09/07/2015 10:25 AM) Furosemide (20MG  Tablet, Oral) Active. Medications Reconciled  Vitals (Alisha Spillers CMA; 09/07/2015 10:24 AM) 09/07/2015 10:23 AM Weight: 231.8 lb Height: 62in Body Surface Area: 2.04 m Body Mass Index: 42.4 kg/m  Pulse: 90 (Regular)  BP: 138/82 (Sitting, Left Arm, Standard)       Physical Exam (Chane Magner B. Daphine Deutscher MD; 09/07/2015 11:17 AM) The physical exam findings are as follows: Note:WDWF weight 231 HEENT unremarkable Neck supple Chest clear Heart SR without murmurs Abdomen lapband port in place Ext with some edema. has been placed back on fluid pill Neuro alert and oriented x 3 with normal motor and sensory function    Assessment & Plan Deanna Edwards B. 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 Tylenol is acceptable Stop smoking 24 hours prior to surgery No alcoholic beverages Report fever greater than 100.5 or excessive nasal drainage suggesting infection Continue bariatric preop diet Perform bowel prep if ordered Do not eat or drink anything after midnight the night before surgery Do not take any medications except those instructed by the anesthesiologist Morning of surgery Please arrive at the hospital at least 2 hours before your scheduled surgery time. No makeup, fingernail polish or jewelry Bring insurance cards with you Bring your CPAP mask if you use this Impression: Plan conversion of lapband to sleeve gastrectomy

## 2015-09-27 ENCOUNTER — Encounter: Payer: 59 | Attending: Surgery | Admitting: Dietician

## 2015-09-27 ENCOUNTER — Encounter: Payer: Self-pay | Admitting: Dietician

## 2015-09-27 DIAGNOSIS — K9509 Other complications of gastric band procedure: Secondary | ICD-10-CM | POA: Insufficient documentation

## 2015-09-27 NOTE — Progress Notes (Signed)
  Medical Nutrition Therapy:  Appt start time: 940 end time:  955.  SWL visit 6 (Patient states she began consecutive visits with physician in March 2017): Kinzly returns having maintained her weight. Anxious to continue to have a tool to help her maintain a healthy weight loss. She continues to choose high protein and low carb foods. Plans to start experimenting with more bariatric-friendly recipes and meal prep.  Weight: 228.8 lbs BMI: 41.9  MEDICATIONS: See List  B (8:30-9 AM): high protein pancakes with sugar free syrup OR Kashi cereal with [place] yogurt L (1-2 PM): leftover salmon, vegetables, and rice Snk: pre portioned chips or crackers D: vegetable beef stew with rice OR wheat spaghetti with meat sauce  Recent physical activity: Not discussed  Handouts provided: none  Progress Towards Goal(s):  In progress.   Nutritional Diagnosis:  [place]-3.3 Overweight/obesity related to past poor dietary habits and physical inactivity as evidenced by patient w/ referral for sleeve gastrectomy surgery following dietary guidelines for continued weight loss.     Intervention:  Nutrition counseling for obesity. Eat protein foods first, then vegetables, then starches if there is room.   Monitoring/Evaluation:  Dietary intake, exercise, and body weight in 1 month(s).

## 2015-10-25 ENCOUNTER — Encounter: Payer: Self-pay | Admitting: Dietician

## 2015-10-25 ENCOUNTER — Encounter: Payer: 59 | Attending: Surgery | Admitting: Dietician

## 2015-10-25 DIAGNOSIS — K9509 Other complications of gastric band procedure: Secondary | ICD-10-CM | POA: Insufficient documentation

## 2015-10-25 NOTE — Patient Instructions (Signed)
-  Remove your trigger foods from the house and stock up on healthy snacks  -string cheese, boiled eggs, preportioned nuts  -Eat protein foods first, then vegetables, then starch if there's room 

## 2015-10-25 NOTE — Progress Notes (Signed)
  Medical Nutrition Therapy:  Appt start time: 200 end time:  215  SWL visit 6 (Patient states she began consecutive visits with physician in March 2017): Deanna Edwards returns for her final SWL appointment having gained a few pounds. Her ankles are very swollen today and she plans to call her doctor to discuss her fluid pill management. She saw Dr. Daphine DeutscherMartin 2 weeks ago and it was confirmed that she had 3 cc's in her band. She states that she felt restriction at first but not anymore. She states that she loved having restriction and is frustrated that her lap band is malfunctioning.  Weight: 238 lbs BMI: 43.5  MEDICATIONS: See List  B (8:30-9 AM): high protein pancakes with sugar free syrup OR Kashi cereal with [place] yogurt L (1-2 PM): leftover salmon, vegetables, and rice Snk: pre portioned chips or crackers D: vegetable beef stew with rice OR wheat spaghetti with meat sauce  Recent physical activity: Not discussed  Handouts provided: none  Progress Towards Goal(s):  In progress.   Nutritional Diagnosis:  [place]-3.3 Overweight/obesity related to past poor dietary habits and physical inactivity as evidenced by patient w/ referral for sleeve gastrectomy surgery following dietary guidelines for continued weight loss.     Intervention:  Nutrition counseling for obesity. Eat protein foods first, then vegetables, then starches if there is room.   Monitoring/Evaluation:  Dietary intake, exercise, and body weight in 1 month(s).

## 2015-11-08 ENCOUNTER — Other Ambulatory Visit (INDEPENDENT_AMBULATORY_CARE_PROVIDER_SITE_OTHER): Payer: Self-pay | Admitting: Orthopaedic Surgery

## 2015-11-08 DIAGNOSIS — M7541 Impingement syndrome of right shoulder: Secondary | ICD-10-CM

## 2015-11-21 ENCOUNTER — Ambulatory Visit
Admission: RE | Admit: 2015-11-21 | Discharge: 2015-11-21 | Disposition: A | Discharge status: Home / Self Care | Payer: 59 | Source: Ambulatory Visit | Attending: Orthopaedic Surgery | Admitting: Orthopaedic Surgery

## 2015-11-21 DIAGNOSIS — M7541 Impingement syndrome of right shoulder: Secondary | ICD-10-CM

## 2015-12-04 ENCOUNTER — Encounter: Payer: 59 | Attending: Surgery | Admitting: Dietician

## 2015-12-04 DIAGNOSIS — K9509 Other complications of gastric band procedure: Secondary | ICD-10-CM | POA: Diagnosis present

## 2015-12-05 ENCOUNTER — Encounter: Payer: Self-pay | Admitting: Dietician

## 2015-12-05 ENCOUNTER — Encounter (HOSPITAL_COMMUNITY)
Admission: RE | Admit: 2015-12-05 | Discharge: 2015-12-05 | Disposition: A | Discharge status: Home / Self Care | Payer: 59 | Source: Ambulatory Visit | Attending: Surgery | Admitting: Surgery

## 2015-12-05 ENCOUNTER — Encounter (HOSPITAL_COMMUNITY): Payer: Self-pay

## 2015-12-05 DIAGNOSIS — G4730 Sleep apnea, unspecified: Secondary | ICD-10-CM | POA: Diagnosis not present

## 2015-12-05 DIAGNOSIS — K219 Gastro-esophageal reflux disease without esophagitis: Secondary | ICD-10-CM | POA: Diagnosis not present

## 2015-12-05 DIAGNOSIS — I10 Essential (primary) hypertension: Secondary | ICD-10-CM | POA: Insufficient documentation

## 2015-12-05 DIAGNOSIS — E669 Obesity, unspecified: Secondary | ICD-10-CM | POA: Insufficient documentation

## 2015-12-05 DIAGNOSIS — Z4651 Encounter for fitting and adjustment of gastric lap band: Secondary | ICD-10-CM | POA: Diagnosis not present

## 2015-12-05 DIAGNOSIS — M1990 Unspecified osteoarthritis, unspecified site: Secondary | ICD-10-CM | POA: Diagnosis not present

## 2015-12-05 DIAGNOSIS — Z9884 Bariatric surgery status: Secondary | ICD-10-CM | POA: Insufficient documentation

## 2015-12-05 DIAGNOSIS — Z01812 Encounter for preprocedural laboratory examination: Secondary | ICD-10-CM | POA: Insufficient documentation

## 2015-12-05 HISTORY — DX: Other specified postprocedural states: Z98.890

## 2015-12-05 HISTORY — DX: Nausea with vomiting, unspecified: R11.2

## 2015-12-05 HISTORY — DX: Pneumonia, unspecified organism: J18.9

## 2015-12-05 HISTORY — DX: Dyspnea, unspecified: R06.00

## 2015-12-05 HISTORY — DX: Unspecified osteoarthritis, unspecified site: M19.90

## 2015-12-05 LAB — CBC WITH DIFFERENTIAL/PLATELET
BASOS ABS: 0 10*3/uL (ref 0.0–0.1)
BASOS PCT: 0 %
EOS ABS: 0.3 10*3/uL (ref 0.0–0.7)
Eosinophils Relative: 4 %
HEMATOCRIT: 42.4 % (ref 36.0–46.0)
HEMOGLOBIN: 14 g/dL (ref 12.0–15.0)
Lymphocytes Relative: 25 %
Lymphs Abs: 1.8 10*3/uL (ref 0.7–4.0)
MCH: 30.3 pg (ref 26.0–34.0)
MCHC: 33 g/dL (ref 30.0–36.0)
MCV: 91.8 fL (ref 78.0–100.0)
Monocytes Absolute: 0.7 10*3/uL (ref 0.1–1.0)
Monocytes Relative: 9 %
NEUTROS ABS: 4.3 10*3/uL (ref 1.7–7.7)
NEUTROS PCT: 62 %
Platelets: 278 10*3/uL (ref 150–400)
RBC: 4.62 MIL/uL (ref 3.87–5.11)
RDW: 14.8 % (ref 11.5–15.5)
WBC: 7 10*3/uL (ref 4.0–10.5)

## 2015-12-05 LAB — COMPREHENSIVE METABOLIC PANEL
ALBUMIN: 4.4 g/dL (ref 3.5–5.0)
ALK PHOS: 78 U/L (ref 38–126)
ALT: 16 U/L (ref 14–54)
AST: 27 U/L (ref 15–41)
Anion gap: 12 (ref 5–15)
BILIRUBIN TOTAL: 0.9 mg/dL (ref 0.3–1.2)
BUN: 20 mg/dL (ref 6–20)
CO2: 29 mmol/L (ref 22–32)
Calcium: 9.7 mg/dL (ref 8.9–10.3)
Chloride: 98 mmol/L — ABNORMAL LOW (ref 101–111)
Creatinine, Ser: 0.64 mg/dL (ref 0.44–1.00)
GFR calc Af Amer: >60 mL/min (ref >60)
GFR calc non Af Amer: >60 mL/min (ref >60)
GLUCOSE: 85 mg/dL (ref 65–99)
POTASSIUM: 4.4 mmol/L (ref 3.5–5.1)
SODIUM: 139 mmol/L (ref 135–145)
TOTAL PROTEIN: 8.1 g/dL (ref 6.5–8.1)

## 2015-12-05 NOTE — Progress Notes (Signed)
Dr Daphine DeutscherMartin--- Your pre op orders were placed 09/07/15-  Epic will drop them after 90 days, so please enter them again for day of surgery.  Thanks

## 2015-12-05 NOTE — Progress Notes (Signed)
Chest 7/17 epic, TEE 9/17,and EKG 3/17 chart, lov dr reade 8/17 chart

## 2015-12-05 NOTE — Progress Notes (Signed)
ADDENDUM TO NOTE PLACED REGARDING TEE--- WAS ECCHOCARDIOGRAM 10/23/15

## 2015-12-05 NOTE — Patient Instructions (Signed)
Oliver PilaDorain Roettger  12/05/2015   Your procedure is scheduled on: 12/11/15  [facility]  [place] to 3rd floor to  Short Stay Center at 0515 AM.  Call this number if you have problems the morning of surgery [phone]   Remember: ONLY 1 PERSON MAY GO WITH YOU TO SHORT STAY TO GET  READY MORNING OF YOUR SURGERY.  Do not eat food or drink liquids :After Midnight.     Take these medicines the morning of surgery with A SIP OF WATER: NONE                                You may not have any metal on your body including hair pins and              piercings  Do not wear jewelry, make-up, lotions, powders or perfumes, deodorant             Do not wear nail polish.  Do not shave  48 hours prior to surgery.              Men may shave face and neck.   Do not bring valuables to the hospital. [place] IS NOT             RESPONSIBLE   FOR VALUABLES.  Contacts, dentures or bridgework may not be worn into surgery.  Leave suitcase in the car. After surgery it may be brought to your room.     Patients discharged the day of surgery           [place] - Preparing for Surgery Before surgery, you can play an important role.  Because skin is not sterile, your skin needs to be as free of germs as possible.  You can reduce the number of germs on your skin by washing with CHG (chlorahexidine gluconate) soap before surgery.  CHG is an antiseptic cleaner which kills germs and bonds with the skin to continue killing germs even after washing. Please DO NOT use if you have an allergy to CHG or antibacterial soaps.  If your skin becomes reddened/irritated stop using the CHG and inform your nurse when you arrive at Short Stay. Do not shave (including legs and underarms) for at least 48 hours prior to the first CHG shower.  You may shave your face/neck. Please follow these instructions carefully:  1.  Shower with CHG Soap the night before surgery and the   morning of Surgery.  2.  If you choose to wash your hair, wash your hair first as usual with your  normal  shampoo.  3.  After you shampoo, rinse your hair and body thoroughly to remove the  shampoo.                           4.  Use CHG as you would any other liquid soap.  You can apply chg directly  to the skin and wash                       Gently with a scrungie or clean washcloth.  5.  Apply the CHG Soap to your body ONLY FROM THE NECK DOWN.   Do not use on face/ open  Wound or open sores. Avoid contact with eyes, ears mouth and genitals (private parts).                       Wash face,  Genitals (private parts) with your normal soap.             6.  Wash thoroughly, paying special attention to the area where your surgery  will be performed.  7.  Thoroughly rinse your body with warm water from the neck down.  8.  DO NOT shower/wash with your normal soap after using and rinsing off  the CHG Soap.                9.  Pat yourself dry with a clean towel.            10.  Wear clean pajamas.            11.  Place clean sheets on your bed the night of your first shower and do not  sleep with pets. Day of Surgery : Do not apply any lotions/deodorants the morning of surgery.  Please wear clean clothes to the hospital/surgery center.  FAILURE TO FOLLOW THESE INSTRUCTIONS MAY RESULT IN THE CANCELLATION OF YOUR SURGERY PATIENT SIGNATURE_________________________________  NURSE SIGNATURE__________________________________  ________________________________________________________________________

## 2015-12-05 NOTE — Progress Notes (Signed)
  Pre-Operative Nutrition Class:  Appt start time: 0086   End time:  1830.  Patient was seen on 12/04/2015 for Pre-Operative Bariatric Surgery [facility]   Surgery date: 12/11/2015 Surgery type: Sleeve gastrectomy Start weight at [facility]: 217 lbs on 06/05/2015 Weight today: 236 lbs  TANITA  BODY COMP RESULTS  12/04/15   BMI (kg/m^2) 43.2   Fat Mass (lbs) 117.6   Fat Free Mass (lbs) 118.2   Total Body Water (lbs) 86   Samples given per MNT protocol. Patient educated on appropriate usage: Bariatric Advantage Multivitamin (mixed fruit - qty 1) Lot #: [id number] Exp: 08/2016  Renee Pain Protein Powder (vanilla - qty 1) Lot #: [id number] Exp: 02/2017  Renee Pain Protein Powder (chicken soup - qty 1) Lot #: [id number] Exp: 02/2017  Renee Pain Protein Powder (chocolate - qty 1) Lot #: [id number] Exp: 02/2017  Unjury Protein Powder (strawberry - qty 1) Lot #: [id number] Exp: 02/2017  The following the learning objectives were met by the patient during this course:  Identify Pre-Op Dietary Goals and will begin 2 weeks pre-operatively  Identify appropriate sources of fluids and proteins   State protein recommendations and appropriate sources pre and post-operatively  Identify Post-Operative Dietary Goals and will follow for 2 weeks post-operatively  Identify appropriate multivitamin and calcium sources  Describe the need for physical activity post-operatively and will follow MD recommendations  State when to call healthcare provider regarding medication questions or post-operative complications  Handouts given during class include:  Pre-Op Bariatric Surgery Diet Handout  Protein Shake Handout  Post-Op Bariatric Surgery Nutrition Handout  BELT Program Information Flyer  Support Group Information Flyer  [facility] Bariatric Supplements Price List  Follow-Up Plan: Patient will follow-up at [facility] 2 weeks post operatively for diet advancement  per MD.

## 2015-12-05 NOTE — Progress Notes (Signed)
LVVM with Sheralyn BoatmanBrenda Garrett [facility] regarding the pre op orders will drop out of epic 12/08/15 and new orders will need to be placed by dr Daphine Deutschermartin

## 2015-12-06 ENCOUNTER — Ambulatory Visit (INDEPENDENT_AMBULATORY_CARE_PROVIDER_SITE_OTHER): Payer: 59 | Admitting: Orthopaedic Surgery

## 2015-12-06 ENCOUNTER — Encounter (INDEPENDENT_AMBULATORY_CARE_PROVIDER_SITE_OTHER): Payer: Self-pay | Admitting: Orthopaedic Surgery

## 2015-12-06 DIAGNOSIS — M25511 Pain in right shoulder: Secondary | ICD-10-CM | POA: Diagnosis not present

## 2015-12-06 DIAGNOSIS — M7541 Impingement syndrome of right shoulder: Secondary | ICD-10-CM

## 2015-12-06 MED ORDER — LIDOCAINE HCL 1 % IJ SOLN
1.0000 mL | INTRAMUSCULAR | Status: AC | PRN
  Administered 2015-12-06: 1 mL

## 2015-12-06 MED ORDER — METHYLPREDNISOLONE ACETATE 40 MG/ML IJ SUSP
40.0000 mg | INTRAMUSCULAR | Status: AC | PRN
  Administered 2015-12-06: 40 mg via INTRA_ARTICULAR

## 2015-12-06 MED ORDER — BUPIVACAINE HCL 0.25 % IJ SOLN
4.0000 mL | INTRAMUSCULAR | Status: AC | PRN
  Administered 2015-12-06: 4 mL via INTRA_ARTICULAR

## 2015-12-06 NOTE — Progress Notes (Signed)
Office Visit Note   Patient: Deanna Edwards           Date of Birth: 24-Aug-1960           MRN: [id number] Visit Date: 12/06/2015              Requested by: No referring provider defined for this encounter. PCP: Lolita Patella, MD   Assessment & Plan: Visit Diagnoses:  1. Right shoulder pain, unspecified chronicity   2. Impingement syndrome of right shoulder     Plan: MRI scan was reviewed with the patient. He has some evidence of mild adhesive capsulitis particularly in the rotator cuff interval. Stable get her arm up overhead has aching pain with the bypass area when she lifts her arm. Discussed options and at this point the options of the physical therapy versus intra-articular injection. We'll proceed with intra-articular injection posterior approach into the shoulder joint see if this will help with the localized synovitis is present on the MRI scan and helps with her symptoms. If she is not making improvement she will call and we could start some physical therapy. She does have some mild wear to the rotator cuff without a full-thickness tear and it is possible at some point it does not improve the shoulder has been 90 considered but not at this time. We discussed pathophysiology of this condition outlined plan for care. She tolerated the injection well.  Follow-Up Instructions: No Follow-up on file.   Orders:  No orders of the defined types were placed in this encounter.  Meds ordered this encounter  Medications  . acetaminophen (TYLENOL) 500 MG tablet    Sig: Take 500 mg by mouth every 6 (six) hours as needed for moderate pain (prn).      Procedures: Large Joint Inj Date/Time: 12/06/2015 11:44 AM Performed by: Eldred Manges Authorized by: Annell Greening C   Consent Given by:  Patient Indications:  Pain Location:  Shoulder Site:  R glenohumeral Needle Size:  22 G Needle Length:  1.5 inches Ultrasound Guidance: No   Fluoroscopic Guidance: No   Arthrogram:  No Medications:  1 mL lidocaine 1 %; 40 mg methylPREDNISolone acetate 40 MG/ML; 4 mL bupivacaine 0.25 % Patient tolerance:  Patient tolerated the procedure well with no immediate complications      Clinical Data: No additional findings. MR Shoulder Right Wo Contrast (Accession [id number]) (Order [id number])  Imaging  Date: 11/21/2015 Department: [facility] AT [address] Released By: Johnny Bridge Authorizing: Eldred Manges, MD  Exam Information   Status Exam Begun  Exam Ended   Final [99] 11/21/2015 3:56 PM 11/21/2015 4:23 PM  PACS Images   Show images for MR Shoulder Right Wo Contrast  Study Result   CLINICAL DATA:  Pain in the right shoulder  EXAM: MRI OF THE RIGHT SHOULDER WITHOUT CONTRAST  TECHNIQUE: Multiplanar, multisequence MR imaging of the shoulder was performed. No intravenous contrast was administered.  COMPARISON:  04/21/2015  FINDINGS: Despite efforts by the technologist and patient, motion artifact is present on today's exam and could not be eliminated. This reduces exam sensitivity and specificity.  Rotator cuff: The moderate supraspinatus tendinopathy with suspected mild partial thickness bursal surface tearing in the critical zone.  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 Type I acromion. Trace subacromial subdeltoid bursitis.  Glenohumeral Joint: There is synovitis in  the rotator interval no joint effusion. Articular cartilage preserved.  Labrum:  Grossly unremarkable.  Bones: No significant extra-articular osseous abnormalities identified.  Other: No supplemental non-categorized findings.  IMPRESSION: 1. Synovitis in the rotator interval, this can be associated with adhesive capsulitis. 2. Mild subacromial subdeltoid  bursitis. 3. Fairly striking AC joint arthropathy with subcortical marrow edema, poor definition of the cortical margins, and surrounding soft tissue edema. 4. Moderate supraspinatus tendinopathy with partial thickness bursal surface tearing of the supraspinatus in the critical zone.   Electronically Signed   By: Gaylyn Rong M.D.   On: 10/1     Subjective: Chief Complaint  Patient presents with  . Right Shoulder - Follow-up    Patient is here for MRI review of Right Shoulder. The shoulder stays sore. Pain is worse when moving it is certain directions.  Patient has previously taken Advil but had to recently change to Tylenol because she is getting ready to have surgery for lap band removal and is getting the gastric sleeve.     Review of Systems   Objective: Vital Signs: BP (!) 135/91 (BP Location: Right Arm)   Pulse 70   Ht 5\' 2"  (1.575 m)   Wt 236 lb (107 kg)   LMP 04/06/2014   BMI 43.16 kg/m   Physical Exam  Constitutional: She is oriented to person, place, and time. She appears well-developed.  HENT:  Head: Normocephalic.  Right Ear: External ear normal.  Left Ear: External ear normal.  Eyes: Pupils are equal, round, and reactive to light.  Neck: No tracheal deviation present. No thyromegaly present.  Cardiovascular: Normal rate.   Pulmonary/Chest: Effort normal.  Abdominal: Soft.  Neurological: She is alert and oriented to person, place, and time.  Skin: Skin is warm and dry.  Psychiatric: She has a normal mood and affect. Her behavior is normal.    Ortho Exam patient has minimal tenderness over the anterior along the biceps tendon. Negative the drop arm test (empty can test). Negative Yergason test. Mildly positive Neer test negative Hawkins test beverages. Extension distal pulses are normal no evidence of carpal tunnel exam skin is normal no supraclavicular lymphadenopathy normal cervical range of motion good flexion and extension  Specialty  Comments:  No specialty comments available.  Imaging: No results found.   PMFS History: Patient Active Problem List   Diagnosis Date Noted  . Impingement syndrome of right shoulder 12/06/2015  . Fitting and adjustment of gastric lap band 11/27/2012  . Lapband APL April 2010 + HH repair 07/10/2012   Past Medical History:  Diagnosis Date  . Arthritis   . Dyspnea    states unchanged  . GERD (gastroesophageal reflux disease)    no issue since lap band surgery  . Hx of laparoscopic gastric banding 08-21-12    10'10  . Hypertension   . Pneumonia   . PONV (postoperative nausea and vomiting)   . Sleep apnea    no CPAP    Family History  Problem Relation Age of Onset  . Hypertension Father   . Emphysema Father   . Asthma Father   . Hypertension Sister   . Cancer Maternal Grandmother     Breast    Past Surgical History:  Procedure Laterality Date  . CERVICAL CONIZATION W/BX    . GASTRIC BANDING PORT REVISION N/A 08/25/2012   Procedure: GASTRIC BANDING PORT REVISION;  Surgeon: Valarie Merino, MD;  Location: [facility];  Service: General;  Laterality: N/A;  Lap Band Port Replacement  .  HERNIA REPAIR    . LAPAROSCOPIC GASTRIC BANDING     hiatal hernia repair also  . TUBAL LIGATION     Social History   Occupational History  . Not on file.   Social History Main Topics  . Smoking status: Former Smoker    Types: Cigarettes    Quit date: 09/14/1994  . Smokeless tobacco: Never Used  . Alcohol use No  . Drug use: No  . Sexual activity: Yes

## 2015-12-10 ENCOUNTER — Ambulatory Visit: Payer: Self-pay | Admitting: Surgery

## 2015-12-10 NOTE — H&P (Signed)
Chief Complaint:  lapband failure  History of Present Illness:  Deanna Edwards is an 54 y.o. female who underwent lapband placement and hiatal hernia repair in 2010 with initial success.  However she has had problems with inabiltiy to successfully obtain restriction and has evidence that the lapband is leaking.  She would like for this to be removed and converted to a sleeve gastrectomy.  She is aware of the risks and the possibility that this would need to be done separately.    Past Medical History:  Diagnosis Date  . Arthritis   . Dyspnea    states unchanged  . GERD (gastroesophageal reflux disease)    no issue since lap band surgery  . Hx of laparoscopic gastric banding 08-21-12    10'10  . Hypertension   . Pneumonia   . PONV (postoperative nausea and vomiting)   . Sleep apnea    no CPAP    Past Surgical History:  Procedure Laterality Date  . CERVICAL CONIZATION W/BX    . GASTRIC BANDING PORT REVISION N/A 08/25/2012   Procedure: GASTRIC BANDING PORT REVISION;  Surgeon: Wilba Mutz B Bannon Giammarco, MD;  Location: [facility];  Service: General;  Laterality: N/A;  Lap Band Port Replacement  . HERNIA REPAIR    . LAPAROSCOPIC GASTRIC BANDING     hiatal hernia repair also  . TUBAL LIGATION      Current Outpatient Prescriptions  Medication Sig Dispense Refill  . acetaminophen (TYLENOL) 500 MG tablet Take 500 mg by mouth every 6 (six) hours as needed for moderate pain (prn).    . ferrous sulfate 325 (65 FE) MG tablet Take 325 mg by mouth 2 (two) times daily.  0  . furosemide (LASIX) 20 MG tablet Take 20 mg by mouth as directed. 20 mg in the morning and may repeat one dose if needed for fluid retention.    . ibuprofen (ADVIL,MOTRIN) 200 MG tablet Take 200 mg by mouth every 6 (six) hours as needed (for pain.).    . Multiple Vitamin (MULTIVITAMIN WITH MINERALS) TABS Take 1 tablet by mouth daily.    . valsartan-hydrochlorothiazide (DIOVAN-HCT) 160-25 MG tablet Take 1 tablet by mouth daily.     No  current facility-administered medications for this visit.    Codeine and Sulfa antibiotics Family History  Problem Relation Age of Onset  . Hypertension Father   . Emphysema Father   . Asthma Father   . Hypertension Sister   . Cancer Maternal Grandmother     Breast   Social History:   reports that she quit smoking about 21 years ago. Her smoking use included Cigarettes. She has never used smokeless tobacco. She reports that she does not drink alcohol or use drugs.   REVIEW OF SYSTEMS : Negative except for see problem list  Physical Exam:   Last menstrual period 04/06/2014. There is no height or weight on file to calculate BMI.  Gen:  WDWN WF NAD  Neurological: Alert and oriented to person, place, and time. Motor and sensory function is grossly intact  Head: Normocephalic and atraumatic.  Eyes: Conjunctivae are normal. Pupils are equal, round, and reactive to light. No scleral icterus.  Neck: Normal range of motion. Neck supple. No tracheal deviation or thyromegaly present.  Cardiovascular:  SR without murmurs or gallops.  No carotid bruits Breast:  Not examined Respiratory: Effort normal.  No respiratory distress. No chest wall tenderness. Breath sounds normal.  No wheezes, rales or rhonchi.  Abdomen:  Port on the right GU:    Not examined Musculoskeletal: Normal range of motion. Extremities are nontender. No cyanosis, edema or clubbing noted Lymphadenopathy: No cervical, preauricular, postauricular or axillary adenopathy is present Skin: Skin is warm and dry. No rash noted. No diaphoresis. No erythema. No pallor. Pscyh: Normal mood and affect. Behavior is normal. Judgment and thought content normal.   LABORATORY RESULTS: No results found for this or any previous visit (from the past 48 hour(s)).   RADIOLOGY RESULTS: No results found.  Problem List: Patient Active Problem List   Diagnosis Date Noted  . Impingement syndrome of right shoulder 12/06/2015  . Fitting and  adjustment of gastric lap band 11/27/2012  . 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 Daphine DeutscherMartin, MD, [facility]  [facility] [phone] beeper [phone]  12/10/2015 7:54 AM

## 2015-12-10 NOTE — Anesthesia Preprocedure Evaluation (Addendum)
Anesthesia Evaluation  Patient identified by MRN, date of birth, ID band Patient awake    Reviewed: Allergy & Precautions, NPO status , Patient's Chart, lab work & pertinent test results  History of Anesthesia Complications (+) PONV, PROLONGED EMERGENCE and history of anesthetic complications  Airway Mallampati: II  TM Distance: >3 FB Neck ROM: Full    Dental  (+) Teeth Intact   Pulmonary shortness of breath, sleep apnea , former smoker,    breath sounds clear to auscultation       Cardiovascular hypertension,  Rhythm:Regular Rate:Normal     Neuro/Psych    GI/Hepatic Neg liver ROS, GERD  ,  Endo/Other  Morbid obesity  Renal/GU negative Renal ROS     Musculoskeletal   Abdominal   Peds  Hematology negative hematology ROS (+)   Anesthesia Other Findings   Reproductive/Obstetrics                            Anesthesia Physical Anesthesia Plan  ASA: III  Anesthesia Plan: General   Post-op Pain Management:    Induction: Intravenous  Airway Management Planned: Oral ETT  Additional Equipment:   Intra-op Plan:   Post-operative Plan: Extubation in OR  Informed Consent:   Dental advisory given  Plan Discussed with: CRNA  Anesthesia Plan Comments:         Anesthesia Quick Evaluation

## 2015-12-11 ENCOUNTER — Inpatient Hospital Stay (HOSPITAL_COMMUNITY): Payer: 59 | Admitting: Anesthesiology

## 2015-12-11 ENCOUNTER — Encounter (HOSPITAL_COMMUNITY): Payer: Self-pay

## 2015-12-11 ENCOUNTER — Encounter (HOSPITAL_COMMUNITY)
Admission: RE | Disposition: A | Discharge status: Home / Self Care | Payer: Self-pay | Source: Ambulatory Visit | Attending: Surgery

## 2015-12-11 ENCOUNTER — Inpatient Hospital Stay (HOSPITAL_COMMUNITY)
Admission: RE | Admit: 2015-12-11 | Discharge: 2015-12-13 | DRG: 621 | Disposition: A | Discharge status: Home / Self Care | Payer: 59 | Source: Ambulatory Visit | Attending: Surgery | Admitting: Surgery

## 2015-12-11 DIAGNOSIS — Z9884 Bariatric surgery status: Secondary | ICD-10-CM

## 2015-12-11 DIAGNOSIS — I10 Essential (primary) hypertension: Secondary | ICD-10-CM | POA: Diagnosis present

## 2015-12-11 DIAGNOSIS — G4730 Sleep apnea, unspecified: Secondary | ICD-10-CM | POA: Diagnosis present

## 2015-12-11 DIAGNOSIS — Z6841 Body Mass Index (BMI) 40.0 and over, adult: Secondary | ICD-10-CM

## 2015-12-11 DIAGNOSIS — Z87891 Personal history of nicotine dependence: Secondary | ICD-10-CM | POA: Diagnosis not present

## 2015-12-11 DIAGNOSIS — K219 Gastro-esophageal reflux disease without esophagitis: Secondary | ICD-10-CM | POA: Diagnosis present

## 2015-12-11 HISTORY — PX: LAPAROSCOPIC GASTRIC BAND REMOVAL WITH LAPAROSCOPIC GASTRIC SLEEVE RESECTION: SHX6498

## 2015-12-11 LAB — CBC
HCT: 40.9 % (ref 36.0–46.0)
Hemoglobin: 13.7 g/dL (ref 12.0–15.0)
MCH: 31.1 pg (ref 26.0–34.0)
MCHC: 33.5 g/dL (ref 30.0–36.0)
MCV: 92.7 fL (ref 78.0–100.0)
PLATELETS: 214 10*3/uL (ref 150–400)
RBC: 4.41 MIL/uL (ref 3.87–5.11)
RDW: 14.7 % (ref 11.5–15.5)
WBC: 12 10*3/uL — AB (ref 4.0–10.5)

## 2015-12-11 LAB — CREATININE, SERUM: CREATININE: 0.69 mg/dL (ref 0.44–1.00)

## 2015-12-11 LAB — PREGNANCY, URINE: PREG TEST UR: NEGATIVE

## 2015-12-11 SURGERY — LAPAROSCOPIC GASTRIC BAND REMOVAL WITH LAPAROSCOPIC GASTRIC SLEEVE RESECTION
Anesthesia: General

## 2015-12-11 MED ORDER — DEXAMETHASONE SODIUM PHOSPHATE 10 MG/ML IJ SOLN
INTRAMUSCULAR | Status: DC | PRN
  Administered 2015-12-11: 10 mg via INTRAVENOUS

## 2015-12-11 MED ORDER — SUFENTANIL CITRATE 50 MCG/ML IV SOLN
INTRAVENOUS | Status: AC
  Filled 2015-12-11: qty 1

## 2015-12-11 MED ORDER — PROPOFOL 10 MG/ML IV BOLUS
INTRAVENOUS | Status: AC
  Filled 2015-12-11: qty 20

## 2015-12-11 MED ORDER — ONDANSETRON HCL 4 MG/2ML IJ SOLN
INTRAMUSCULAR | Status: AC
Start: 2015-12-11 — End: 2015-12-11
  Filled 2015-12-11: qty 2

## 2015-12-11 MED ORDER — CEFOTETAN DISODIUM-DEXTROSE 2-2.08 GM-% IV SOLR
INTRAVENOUS | Status: AC
  Filled 2015-12-11: qty 50

## 2015-12-11 MED ORDER — MIDAZOLAM HCL 2 MG/2ML IJ SOLN
INTRAMUSCULAR | Status: DC | PRN
  Administered 2015-12-11: 2 mg via INTRAVENOUS

## 2015-12-11 MED ORDER — MIDAZOLAM HCL 2 MG/2ML IJ SOLN
INTRAMUSCULAR | Status: AC
  Filled 2015-12-11: qty 2

## 2015-12-11 MED ORDER — SCOPOLAMINE 1 MG/3DAYS TD PT72
MEDICATED_PATCH | TRANSDERMAL | Status: AC
  Filled 2015-12-11: qty 1

## 2015-12-11 MED ORDER — CHLORHEXIDINE GLUCONATE CLOTH 2 % EX PADS: 6.0000 | MEDICATED_PAD | Freq: Once | CUTANEOUS | Status: DC

## 2015-12-11 MED ORDER — ONDANSETRON HCL 4 MG/2ML IJ SOLN
INTRAMUSCULAR | Status: DC | PRN
  Administered 2015-12-11: 4 mg via INTRAVENOUS

## 2015-12-11 MED ORDER — SUGAMMADEX SODIUM 500 MG/5ML IV SOLN
INTRAVENOUS | Status: AC
  Filled 2015-12-11: qty 5

## 2015-12-11 MED ORDER — ROCURONIUM BROMIDE 10 MG/ML (PF) SYRINGE
PREFILLED_SYRINGE | INTRAVENOUS | Status: DC | PRN
  Administered 2015-12-11: 20 mg via INTRAVENOUS
  Administered 2015-12-11: 50 mg via INTRAVENOUS
  Administered 2015-12-11: 10 mg via INTRAVENOUS
  Administered 2015-12-11: 20 mg via INTRAVENOUS

## 2015-12-11 MED ORDER — SUCCINYLCHOLINE CHLORIDE 200 MG/10ML IV SOSY
PREFILLED_SYRINGE | INTRAVENOUS | Status: DC | PRN
  Administered 2015-12-11: 100 mg via INTRAVENOUS

## 2015-12-11 MED ORDER — ACETAMINOPHEN 160 MG/5ML PO SOLN: 325.0000 mg | ORAL | Status: DC | PRN

## 2015-12-11 MED ORDER — SCOPOLAMINE 1 MG/3DAYS TD PT72
MEDICATED_PATCH | TRANSDERMAL | Status: DC | PRN
  Administered 2015-12-11: 1 via TRANSDERMAL

## 2015-12-11 MED ORDER — KCL IN DEXTROSE-NACL 20-5-0.45 MEQ/L-%-% IV SOLN
INTRAVENOUS | Status: DC
  Administered 2015-12-11 – 2015-12-13 (×4): via INTRAVENOUS
  Filled 2015-12-11 (×7): qty 1000

## 2015-12-11 MED ORDER — LIDOCAINE 2% (20 MG/ML) 5 ML SYRINGE
INTRAMUSCULAR | Status: AC
  Filled 2015-12-11: qty 5

## 2015-12-11 MED ORDER — APREPITANT 40 MG PO CAPS
40.0000 mg | ORAL_CAPSULE | ORAL | Status: AC
  Administered 2015-12-11: 40 mg via ORAL
  Filled 2015-12-11: qty 1

## 2015-12-11 MED ORDER — SUFENTANIL CITRATE 50 MCG/ML IV SOLN
INTRAVENOUS | Status: DC | PRN
  Administered 2015-12-11: 10 ug via INTRAVENOUS
  Administered 2015-12-11: 20 ug via INTRAVENOUS
  Administered 2015-12-11: 10 ug via INTRAVENOUS
  Administered 2015-12-11: 5 ug via INTRAVENOUS
  Administered 2015-12-11: 10 ug via INTRAVENOUS
  Administered 2015-12-11: 5 ug via INTRAVENOUS
  Administered 2015-12-11: 10 ug via INTRAVENOUS

## 2015-12-11 MED ORDER — LACTATED RINGERS IR SOLN
Status: DC | PRN
  Administered 2015-12-11: 1000 mL

## 2015-12-11 MED ORDER — CEFOTETAN DISODIUM-DEXTROSE 2-2.08 GM-% IV SOLR
2.0000 g | INTRAVENOUS | Status: AC
  Administered 2015-12-11: 2 g via INTRAVENOUS

## 2015-12-11 MED ORDER — PREMIER PROTEIN SHAKE
2.0000 [oz_av] | ORAL | Status: DC
  Administered 2015-12-13 (×3): 2 [oz_av] via ORAL

## 2015-12-11 MED ORDER — LIDOCAINE 2% (20 MG/ML) 5 ML SYRINGE
INTRAMUSCULAR | Status: DC | PRN
  Administered 2015-12-11: 100 mg via INTRAVENOUS

## 2015-12-11 MED ORDER — ROCURONIUM BROMIDE 10 MG/ML (PF) SYRINGE
PREFILLED_SYRINGE | INTRAVENOUS | Status: AC
  Filled 2015-12-11: qty 10

## 2015-12-11 MED ORDER — SODIUM CHLORIDE 0.9 % IJ SOLN
INTRAMUSCULAR | Status: AC
  Filled 2015-12-11: qty 10

## 2015-12-11 MED ORDER — LACTATED RINGERS IV SOLN
INTRAVENOUS | Status: DC | PRN
  Administered 2015-12-11 (×2): via INTRAVENOUS

## 2015-12-11 MED ORDER — ONDANSETRON HCL 4 MG/2ML IJ SOLN
4.0000 mg | INTRAMUSCULAR | Status: DC | PRN
  Administered 2015-12-12 – 2015-12-13 (×5): 4 mg via INTRAVENOUS
  Filled 2015-12-11 (×5): qty 2

## 2015-12-11 MED ORDER — SUGAMMADEX SODIUM 200 MG/2ML IV SOLN
INTRAVENOUS | Status: DC | PRN
  Administered 2015-12-11: 250 mg via INTRAVENOUS

## 2015-12-11 MED ORDER — BUPIVACAINE LIPOSOME 1.3 % IJ SUSP
20.0000 mL | Freq: Once | INTRAMUSCULAR | Status: AC
  Administered 2015-12-11: 20 mL
  Filled 2015-12-11: qty 20

## 2015-12-11 MED ORDER — EPHEDRINE SULFATE 50 MG/ML IJ SOLN
INTRAMUSCULAR | Status: DC | PRN
  Administered 2015-12-11 (×2): 5 mg via INTRAVENOUS

## 2015-12-11 MED ORDER — HEPARIN SODIUM (PORCINE) 5000 UNIT/ML IJ SOLN
5000.0000 [IU] | Freq: Three times a day (TID) | INTRAMUSCULAR | Status: DC
  Administered 2015-12-11 – 2015-12-13 (×5): 5000 [IU] via SUBCUTANEOUS
  Filled 2015-12-11 (×5): qty 1

## 2015-12-11 MED ORDER — HEPARIN SODIUM (PORCINE) 5000 UNIT/ML IJ SOLN
5000.0000 [IU] | INTRAMUSCULAR | Status: AC
  Administered 2015-12-11: 5000 [IU] via SUBCUTANEOUS
  Filled 2015-12-11: qty 1

## 2015-12-11 MED ORDER — EPHEDRINE 5 MG/ML INJ
INTRAVENOUS | Status: AC
  Filled 2015-12-11: qty 10

## 2015-12-11 MED ORDER — HYDROMORPHONE HCL 1 MG/ML IJ SOLN
0.2500 mg | INTRAMUSCULAR | Status: DC | PRN
  Administered 2015-12-11 (×3): 0.5 mg via INTRAVENOUS

## 2015-12-11 MED ORDER — HYDROMORPHONE HCL 1 MG/ML IJ SOLN
INTRAMUSCULAR | Status: AC
  Filled 2015-12-11: qty 1

## 2015-12-11 MED ORDER — SODIUM CHLORIDE 0.9 % IJ SOLN
INTRAMUSCULAR | Status: DC | PRN
  Administered 2015-12-11: 10 mL

## 2015-12-11 MED ORDER — PROMETHAZINE HCL 25 MG/ML IJ SOLN
INTRAMUSCULAR | Status: AC
  Filled 2015-12-11: qty 1

## 2015-12-11 MED ORDER — ACETAMINOPHEN 160 MG/5ML PO SOLN
650.0000 mg | ORAL | Status: DC | PRN
  Administered 2015-12-13: 650 mg via ORAL
  Filled 2015-12-11: qty 20.3

## 2015-12-11 MED ORDER — FENTANYL CITRATE (PF) 100 MCG/2ML IJ SOLN
12.5000 ug | INTRAMUSCULAR | Status: DC | PRN
  Administered 2015-12-11 – 2015-12-12 (×10): 12.5 ug via INTRAVENOUS
  Filled 2015-12-11 (×10): qty 2

## 2015-12-11 MED ORDER — 0.9 % SODIUM CHLORIDE (POUR BTL) OPTIME
TOPICAL | Status: DC | PRN
  Administered 2015-12-11: 1000 mL

## 2015-12-11 MED ORDER — PROMETHAZINE HCL 25 MG/ML IJ SOLN
6.2500 mg | INTRAMUSCULAR | Status: DC | PRN
  Administered 2015-12-11: 12.5 mg via INTRAVENOUS

## 2015-12-11 MED ORDER — DEXAMETHASONE SODIUM PHOSPHATE 10 MG/ML IJ SOLN
INTRAMUSCULAR | Status: AC
Start: 2015-12-11 — End: 2015-12-11
  Filled 2015-12-11: qty 1

## 2015-12-11 MED ORDER — OXYCODONE HCL 5 MG/5ML PO SOLN
5.0000 mg | ORAL | Status: DC | PRN
  Administered 2015-12-13: 5 mg via ORAL
  Filled 2015-12-11: qty 5

## 2015-12-11 MED ORDER — PROPOFOL 10 MG/ML IV BOLUS
INTRAVENOUS | Status: DC | PRN
  Administered 2015-12-11: 150 mg via INTRAVENOUS

## 2015-12-11 SURGICAL SUPPLY — 65 items
APPLICATOR COTTON TIP 6IN STRL (MISCELLANEOUS) ×3 IMPLANT
APPLIER CLIP ROT 10 11.4 M/L (STAPLE)
APPLIER CLIP ROT 13.4 12 LRG (CLIP)
BLADE HEX COATED 2.75 (ELECTRODE) ×3 IMPLANT
BLADE SURG 15 STRL LF DISP TIS (BLADE) ×1 IMPLANT
BLADE SURG 15 STRL SS (BLADE) ×2
CABLE HIGH FREQUENCY MONO STRZ (ELECTRODE) IMPLANT
CLIP APPLIE ROT 10 11.4 M/L (STAPLE) IMPLANT
CLIP APPLIE ROT 13.4 12 LRG (CLIP) IMPLANT
COVER SURGICAL LIGHT HANDLE (MISCELLANEOUS) ×3 IMPLANT
DERMABOND ADVANCED (GAUZE/BANDAGES/DRESSINGS) ×2
DERMABOND ADVANCED .7 DNX12 (GAUZE/BANDAGES/DRESSINGS) ×1 IMPLANT
DEVICE SUT QUICK LOAD TK 5 (STAPLE) IMPLANT
DEVICE SUT TI-KNOT TK 5X26 (MISCELLANEOUS) IMPLANT
DEVICE SUTURE ENDOST 10MM (ENDOMECHANICALS) IMPLANT
DEVICE TI KNOT TK5 (MISCELLANEOUS)
DEVICE TROCAR PUNCTURE CLOSURE (ENDOMECHANICALS) ×3 IMPLANT
ELECT PENCIL ROCKER SW 15FT (MISCELLANEOUS) ×3 IMPLANT
ELECT REM PT RETURN 9FT ADLT (ELECTROSURGICAL) ×3
ELECTRODE REM PT RTRN 9FT ADLT (ELECTROSURGICAL) ×1 IMPLANT
EVACUATOR SILICONE 100CC (DRAIN) IMPLANT
GAUZE SPONGE 4X4 12PLY STRL (GAUZE/BANDAGES/DRESSINGS) IMPLANT
GLOVE BIOGEL M 8.0 STRL (GLOVE) ×3 IMPLANT
GOWN STRL REUS W/TWL XL LVL3 (GOWN DISPOSABLE) ×12 IMPLANT
HANDLE STAPLE EGIA 4 XL (STAPLE) ×3 IMPLANT
HOVERMATT SINGLE USE (MISCELLANEOUS) ×3 IMPLANT
IRRIG SUCT STRYKERFLOW 2 WTIP (MISCELLANEOUS) ×3
IRRIGATION SUCT STRKRFLW 2 WTP (MISCELLANEOUS) ×1 IMPLANT
KIT BASIN OR (CUSTOM PROCEDURE TRAY) ×3 IMPLANT
NEEDLE SPNL 22GX3.5 QUINCKE BK (NEEDLE) ×3 IMPLANT
PACK UNIVERSAL I (CUSTOM PROCEDURE TRAY) ×3 IMPLANT
POUCH SPECIMEN RETRIEVAL 10MM (ENDOMECHANICALS) ×3 IMPLANT
QUICK LOAD TK 5 (STAPLE)
RELOAD ENDO STITCH (ENDOMECHANICALS) IMPLANT
RELOAD TRI 45 ART MED THCK BLK (STAPLE) ×3 IMPLANT
RELOAD TRI 45 ART MED THCK PUR (STAPLE) IMPLANT
RELOAD TRI 60 ART MED THCK BLK (STAPLE) ×9 IMPLANT
RELOAD TRI 60 ART MED THCK PUR (STAPLE) ×3 IMPLANT
SCISSORS LAP 5X45 EPIX DISP (ENDOMECHANICALS) ×3 IMPLANT
SCRUB TECHNI CARE 4 OZ NO DYE (MISCELLANEOUS) ×3 IMPLANT
SEALANT SURGICAL APPL DUAL CAN (MISCELLANEOUS) IMPLANT
SHEARS HARMONIC ACE PLUS 45CM (MISCELLANEOUS) ×3 IMPLANT
SLEEVE ADV FIXATION 12X100MM (TROCAR) IMPLANT
SLEEVE ADV FIXATION 5X100MM (TROCAR) ×6 IMPLANT
SLEEVE GASTRECTOMY 36FR VISIGI (MISCELLANEOUS) ×3 IMPLANT
SOLUTION ANTI FOG 6CC (MISCELLANEOUS) ×3 IMPLANT
SPONGE LAP 18X18 X RAY DECT (DISPOSABLE) ×3 IMPLANT
STAPLER VISISTAT 35W (STAPLE) ×3 IMPLANT
SUT ETHILON 2 0 PS N (SUTURE) IMPLANT
SUT VIC AB 4-0 SH 18 (SUTURE) ×3 IMPLANT
SUT VICRYL 0 TIES 12 18 (SUTURE) ×3 IMPLANT
SYR 20CC LL (SYRINGE) ×3 IMPLANT
SYR 50ML LL SCALE MARK (SYRINGE) ×3 IMPLANT
TOWEL OR 17X26 10 PK STRL BLUE (TOWEL DISPOSABLE) ×3 IMPLANT
TOWEL OR NON WOVEN STRL DISP B (DISPOSABLE) ×3 IMPLANT
TRAY FOLEY CATH 14FRSI W/METER (CATHETERS) ×3 IMPLANT
TRAY FOLEY W/METER SILVER 16FR (SET/KITS/TRAYS/PACK) IMPLANT
TROCAR ADV FIXATION 12X100MM (TROCAR) ×3 IMPLANT
TROCAR ADV FIXATION 5X100MM (TROCAR) ×3 IMPLANT
TROCAR BLADELESS 15MM (ENDOMECHANICALS) ×3 IMPLANT
TROCAR BLADELESS OPT 5 100 (ENDOMECHANICALS) ×3 IMPLANT
TUBING CONNECTING 10 (TUBING) ×2 IMPLANT
TUBING CONNECTING 10' (TUBING) ×1
TUBING ENDO SMARTCAP (MISCELLANEOUS) ×3 IMPLANT
TUBING INSUF HEATED (TUBING) ×3 IMPLANT

## 2015-12-11 NOTE — Anesthesia Procedure Notes (Signed)
Procedure Name: Intubation Date/Time: 12/11/2015 7:28 AM Performed by: Leroy LibmanEARDON, Dylann Gallier L Patient Re-evaluated:Patient Re-evaluated prior to inductionOxygen Delivery Method: Circle system utilized Preoxygenation: Pre-oxygenation with 100% oxygen Intubation Type: IV induction Ventilation: Mask ventilation without difficulty and Oral airway inserted - appropriate to patient size Laryngoscope Size: Hyacinth MeekerMiller and 2 Grade View: Grade I Tube type: Oral Tube size: 7.5 mm Number of attempts: 1 Airway Equipment and Method: Stylet Placement Confirmation: ETT inserted through vocal cords under direct vision,  positive ETCO2 and breath sounds checked- equal and bilateral Secured at: 21 cm Tube secured with: Tape Dental Injury: Teeth and Oropharynx as per pre-operative assessment

## 2015-12-11 NOTE — Anesthesia Postprocedure Evaluation (Signed)
Anesthesia Post Note  Patient: Deanna Edwards  Procedure(s) Performed: Procedure(s) (LRB): LAPAROSCOPIC GASTRIC BAND REMOVAL WITH LAPAROSCOPIC GASTRIC SLEEVE RESECTION WITH UPPER ENDO (N/A)  Patient location during evaluation: PACU Anesthesia Type: General Level of consciousness: awake and alert Pain management: pain level controlled Vital Signs Assessment: post-procedure vital signs reviewed and stable Respiratory status: spontaneous breathing, nonlabored ventilation, respiratory function stable and patient connected to nasal cannula oxygen Cardiovascular status: blood pressure returned to baseline and stable Postop Assessment: no signs of nausea or vomiting Anesthetic complications: no    Last Vitals:  Vitals:   12/11/15 1215 12/11/15 1315  BP: (!) 153/82 (!) 163/64  Pulse: 78 68  Resp: 16 16  Temp: 36.4 C 36.5 C    Last Pain:  Vitals:   12/11/15 1319  TempSrc:   PainSc: 8                  Roxana Lai,JAMES TERRILL

## 2015-12-11 NOTE — Discharge Instructions (Addendum)

## 2015-12-11 NOTE — Interval H&P Note (Signed)
History and Physical Interval Note:  12/11/2015 7:17 AM  Deanna Edwards  has presented today for surgery, with the diagnosis of MORBID OBESITY  The various methods of treatment have been discussed with the patient and family. After consideration of risks, benefits and other options for treatment, the patient has consented to  Procedure(s): LAPAROSCOPIC GASTRIC BAND REMOVAL WITH LAPAROSCOPIC GASTRIC SLEEVE RESECTION WITH UPPER ENDO (N/A) as a surgical intervention .  The patient's history has been reviewed, patient examined, no change in status, stable for surgery.  I have reviewed the patient's chart and labs.  Questions were answered to the patient's satisfaction.     Dannon Perlow B

## 2015-12-11 NOTE — H&P (View-Only) (Signed)
Chief Complaint:  lapband failure  History of Present Illness:  Deanna Edwards is an 55 y.o. female who underwent lapband placement and hiatal hernia repair in 2010 with initial success.  However she has had problems with inabiltiy to successfully obtain restriction and has evidence that the lapband is leaking.  She would like for this to be removed and converted to a sleeve gastrectomy.  She is aware of the risks and the possibility that this would need to be done separately.    Past Medical History:  Diagnosis Date  . Arthritis   . Dyspnea    states unchanged  . GERD (gastroesophageal reflux disease)    no issue since lap band surgery  . Hx of laparoscopic gastric banding 08-21-12    10'10  . Hypertension   . Pneumonia   . PONV (postoperative nausea and vomiting)   . Sleep apnea    no CPAP    Past Surgical History:  Procedure Laterality Date  . CERVICAL CONIZATION W/BX    . GASTRIC BANDING PORT REVISION N/A 08/25/2012   Procedure: GASTRIC BANDING PORT REVISION;  Surgeon: Valarie MerinoMatthew B Sharolyn Weber, MD;  Location: [facility];  Service: General;  Laterality: N/A;  Lap Band Port Replacement  . HERNIA REPAIR    . LAPAROSCOPIC GASTRIC BANDING     hiatal hernia repair also  . TUBAL LIGATION      Current Outpatient Prescriptions  Medication Sig Dispense Refill  . acetaminophen (TYLENOL) 500 MG tablet Take 500 mg by mouth every 6 (six) hours as needed for moderate pain (prn).    . ferrous sulfate 325 (65 FE) MG tablet Take 325 mg by mouth 2 (two) times daily.  0  . furosemide (LASIX) 20 MG tablet Take 20 mg by mouth as directed. 20 mg in the morning and may repeat one dose if needed for fluid retention.    Marland Kitchen. ibuprofen (ADVIL,MOTRIN) 200 MG tablet Take 200 mg by mouth every 6 (six) hours as needed (for pain.).    Marland Kitchen. Multiple Vitamin (MULTIVITAMIN WITH MINERALS) TABS Take 1 tablet by mouth daily.    . valsartan-hydrochlorothiazide (DIOVAN-HCT) 160-25 MG tablet Take 1 tablet by mouth daily.     No  current facility-administered medications for this visit.    Codeine and Sulfa antibiotics Family History  Problem Relation Age of Onset  . Hypertension Father   . Emphysema Father   . Asthma Father   . Hypertension Sister   . Cancer Maternal Grandmother     Breast   Social History:   reports that she quit smoking about 21 years ago. Her smoking use included Cigarettes. She has never used smokeless tobacco. She reports that she does not drink alcohol or use drugs.   REVIEW OF SYSTEMS : Negative except for see problem list  Physical Exam:   Last menstrual period 04/06/2014. There is no height or weight on file to calculate BMI.  Gen:  WDWN WF NAD  Neurological: Alert and oriented to person, place, and time. Motor and sensory function is grossly intact  Head: Normocephalic and atraumatic.  Eyes: Conjunctivae are normal. Pupils are equal, round, and reactive to light. No scleral icterus.  Neck: Normal range of motion. Neck supple. No tracheal deviation or thyromegaly present.  Cardiovascular:  SR without murmurs or gallops.  No carotid bruits Breast:  Not examined Respiratory: Effort normal.  No respiratory distress. No chest wall tenderness. Breath sounds normal.  No wheezes, rales or rhonchi.  Abdomen:  Port on the right GU:  Not examined Musculoskeletal: Normal range of motion. Extremities are nontender. No cyanosis, edema or clubbing noted Lymphadenopathy: No cervical, preauricular, postauricular or axillary adenopathy is present Skin: Skin is warm and dry. No rash noted. No diaphoresis. No erythema. No pallor. Pscyh: Normal mood and affect. Behavior is normal. Judgment and thought content normal.   LABORATORY RESULTS: No results found for this or any previous visit (from the past 48 hour(s)).   RADIOLOGY RESULTS: No results found.  Problem List: Patient Active Problem List   Diagnosis Date Noted  . Impingement syndrome of right shoulder 12/06/2015  . Fitting and  adjustment of gastric lap band 11/27/2012  . 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 Daphine DeutscherMartin, MD, [facility]  [facility] [phone] beeper [phone]  12/10/2015 7:54 AM

## 2015-12-11 NOTE — Op Note (Signed)
Surgeon: Wenda LowMatt Muntaha Vermette, MD, FACS  Asst:  Jaclynn GuarneriBen Hoxworth M.D.  Anes:  General endotracheal  Procedure: Removal of LAP-BAND, Laparoscopic sleeve gastrectomy and upper endoscopy  Diagnosis: Morbid obesity with lap band failure  Complications: None noted  EBL:   15 cc  Description of Procedure:  The patient was take to OR 1 and given general anesthesia.  The abdomen was prepped with PCMX and draped sterilely.  A timeout was performed.  Access to the abdomen was achieved with a 5 mm Optiview through the left upper quadrant. A second 5 was placed on the left side for the scope. The Nathanson retractor was inserted to expose the band where it was stuck to the liver. These adhesions were taken all the way up to the esophagogastric junction. No hiatal hernia could be seen the plications anteriorly it becomes somewhat attenuated. These were divided and then the band was transected and removed. The cicatrix was divided and the anterior plication taken down all the way laterally at this point we felt the removed band and not show any evidence of any contraindications to completion sleeve at this time..  Following insufflation, the state of the abdomen was found to be as described before with some adhesions involving the tubing..  The ViSiGi 36Fr tube was inserted to deflate the stomach and was pulled back into the esophagus.    The pylorus was identified and we measured 5 cm back and marked the antrum.  At that point we began dissection to take down the greater curvature of the stomach using the Harmonic scalpel.  This dissection was taken all the way up to the left crus.  Posterior attachments of the stomach were also taken down.    The ViSiGi tube was then passed into the antrum and suction applied so that it was snug along the lessor curvature.  The "crow's foot" or incisura was identified.  The sleeve gastrectomy was begun using the Lexmark InternationalCovidien platform stapler beginning with a 4. 5 black with TRS followed by  a 6 cm Black with TRS followed by purple with TRS and then the remaining black 6 cm with TRS.  When the sleeve was complete the tube was taken off suction and insufflated briefly.  The tube was withdrawn.  Upper endoscopy was then performed by Dr. Johna SheriffHoxworth. No bubbles or bleeding were seen. The scope advanced nicely through and I cylindrical sleeve. At the incisura there was a slight angulation going into the antrum..  The port was taken out through a slightly more lateral incision in the port and the remnant tubing was withdrawn. It appeared that the leak was at the junction of the 2 where there was slight erosion of the connector tubing to the through the tubing likely causing the deflation in the band.   The specimen was extracted through the 15 trocar site.  Wounds were infiltrated with ex Brill and closed with 4-0 Monocryl. The 15 port was closed using the Endo Close and a single 0 Vicryl.  Dermabond was used on the skin. The patient was taken recovery room in satisfactory condition.  Matt B. Daphine DeutscherMartin, MD, [facility] [place] [phone]

## 2015-12-11 NOTE — Transfer of Care (Signed)
Immediate Anesthesia Transfer of Care Note  Patient: Deanna Edwards  Procedure(s) Performed: Procedure(s): LAPAROSCOPIC GASTRIC BAND REMOVAL WITH LAPAROSCOPIC GASTRIC SLEEVE RESECTION WITH UPPER ENDO (N/A)  Patient Location: PACU  Anesthesia Type:General  Level of Consciousness: awake and oriented  Airway & Oxygen Therapy: Patient Spontanous Breathing and Patient connected to face mask oxygen  Post-op Assessment: Report given to RN and Post -op Vital signs reviewed and stable  Post vital signs: Reviewed and stable  Last Vitals:  Vitals:   12/11/15 0540  BP: (!) 146/84  Pulse: 80  Resp: 16  Temp: 36.8 C    Last Pain:  Vitals:   12/11/15 0540  TempSrc: Oral         Complications: No apparent anesthesia complications

## 2015-12-12 ENCOUNTER — Inpatient Hospital Stay (HOSPITAL_COMMUNITY): Payer: 59

## 2015-12-12 LAB — HEMOGLOBIN AND HEMATOCRIT, BLOOD
HEMATOCRIT: 38.3 % (ref 36.0–46.0)
Hemoglobin: 12.7 g/dL (ref 12.0–15.0)

## 2015-12-12 LAB — CBC WITH DIFFERENTIAL/PLATELET
BASOS ABS: 0 10*3/uL (ref 0.0–0.1)
BASOS PCT: 0 %
Eosinophils Absolute: 0 10*3/uL (ref 0.0–0.7)
Eosinophils Relative: 0 %
HEMATOCRIT: 38.6 % (ref 36.0–46.0)
Hemoglobin: 12.9 g/dL (ref 12.0–15.0)
LYMPHS ABS: 1 10*3/uL (ref 0.7–4.0)
Lymphocytes Relative: 12 %
MCH: 30.3 pg (ref 26.0–34.0)
MCHC: 33.4 g/dL (ref 30.0–36.0)
MCV: 90.6 fL (ref 78.0–100.0)
MONOS PCT: 13 %
Monocytes Absolute: 1.1 10*3/uL — ABNORMAL HIGH (ref 0.1–1.0)
NEUTROS ABS: 6.1 10*3/uL (ref 1.7–7.7)
Neutrophils Relative %: 75 %
Platelets: 256 10*3/uL (ref 150–400)
RBC: 4.26 MIL/uL (ref 3.87–5.11)
RDW: 14.8 % (ref 11.5–15.5)
WBC: 8.2 10*3/uL (ref 4.0–10.5)

## 2015-12-12 MED ORDER — IOPAMIDOL (ISOVUE-300) INJECTION 61%
50.0000 mL | Freq: Once | INTRAVENOUS | Status: AC | PRN
  Administered 2015-12-12: 50 mL via ORAL

## 2015-12-13 LAB — CBC WITH DIFFERENTIAL/PLATELET
BASOS ABS: 0 10*3/uL (ref 0.0–0.1)
Basophils Relative: 0 %
EOS PCT: 0 %
Eosinophils Absolute: 0 10*3/uL (ref 0.0–0.7)
HEMATOCRIT: 36.9 % (ref 36.0–46.0)
Hemoglobin: 12.1 g/dL (ref 12.0–15.0)
LYMPHS ABS: 1.1 10*3/uL (ref 0.7–4.0)
LYMPHS PCT: 16 %
MCH: 30.6 pg (ref 26.0–34.0)
MCHC: 32.8 g/dL (ref 30.0–36.0)
MCV: 93.4 fL (ref 78.0–100.0)
MONO ABS: 0.9 10*3/uL (ref 0.1–1.0)
MONOS PCT: 12 %
NEUTROS ABS: 5.2 10*3/uL (ref 1.7–7.7)
Neutrophils Relative %: 72 %
PLATELETS: 234 10*3/uL (ref 150–400)
RBC: 3.95 MIL/uL (ref 3.87–5.11)
RDW: 15.3 % (ref 11.5–15.5)
WBC: 7.3 10*3/uL (ref 4.0–10.5)

## 2015-12-13 MED ORDER — PANTOPRAZOLE SODIUM 20 MG PO TBEC: 20.0000 mg | DELAYED_RELEASE_TABLET | Freq: Every day | ORAL | 1 refills | Status: AC

## 2015-12-13 MED ORDER — OXYCODONE HCL 5 MG/5ML PO SOLN: 5.0000 mg | ORAL | 0 refills | Status: AC | PRN

## 2015-12-13 MED ORDER — PANTOPRAZOLE SODIUM 40 MG PO TBEC
40.0000 mg | DELAYED_RELEASE_TABLET | Freq: Once | ORAL | Status: AC
  Administered 2015-12-13: 40 mg via ORAL
  Filled 2015-12-13: qty 1

## 2015-12-13 MED ORDER — ONDANSETRON 4 MG PO TBDP: 4.0000 mg | ORAL_TABLET | Freq: Three times a day (TID) | ORAL | 0 refills | Status: AC | PRN

## 2015-12-13 MED FILL — oxyCODONE HCL 5 MG/5ML SOLN: 5 | 4 days supply | Qty: 200 | Fill #0

## 2015-12-13 NOTE — Progress Notes (Signed)
Patient having a hard time drinking water and shakes. Continues to complain of acid reflux, per patient statement.  Daphine DeutscherMartin, MD paged and notified of patient's condition.

## 2015-12-13 NOTE — Plan of Care (Signed)
Problem: Food- and Nutrition-Related Knowledge Deficit (NB-1.1) Goal: Nutrition education Formal process to instruct or train a patient/client in a skill or to impart knowledge to help patients/clients voluntarily manage or modify food choices and eating behavior to maintain or improve health. Outcome: Completed/Met Date Met: 12/13/15 Nutrition Education Note  Received consult for diet education per DROP protocol. Pt c/o heartburn and nausea. Notified RN, pt just provided nausea medication.  Discussed 2 week post op diet with pt. Emphasized that liquids must be non carbonated, non caffeinated, and sugar free. Fluid goals discussed. Reviewed progression of diet to include soft proteins at 7-10 days post-op. Pt to follow up with outpatient bariatric RD for further diet progression after 2 weeks. Multivitamins and minerals also reviewed. Teach back method used, pt expressed understanding, expect good compliance.   Diet: First 2 Weeks  You will see the dietitian about two (2) weeks after your surgery. The dietitian will increase the types of foods you can eat if you are handling liquids well:  If you have severe vomiting or nausea and cannot handle clear liquids lasting longer than 1 day, call your surgeon  Protein Shake  Drink at least 2 ounces of shake 5-6 times per day  Each serving of protein shakes (usually 8 - 12 ounces) should have a minimum of:  15 grams of protein  And no more than 5 grams of carbohydrate  Goal for protein each day:  Men = 80 grams per day  Women = 60 grams per day  Protein powder may be added to fluids such as non-fat milk or Lactaid milk or Soy milk (limit to 35 grams added protein powder per serving)   Hydration  Slowly increase the amount of water and other clear liquids as tolerated (See Acceptable Fluids)  Slowly increase the amount of protein shake as tolerated  Sip fluids slowly and throughout the day  May use sugar substitutes in small amounts (no more than  6 - 8 packets per day; i.e. Splenda)   Fluid Goal  The first goal is to drink at least 8 ounces of protein shake/drink per day (or as directed by the nutritionist); some examples of protein shakes are [employer], [employer], EAS Edge HP, and Unjury. See handout from pre-op Bariatric Education Class:  Slowly increase the amount of protein shake you drink as tolerated  You may find it easier to slowly sip shakes throughout the day  It is important to get your proteins in first  Your fluid goal is to drink 64 - 100 ounces of fluid daily  It may take a few weeks to build up to this  32 oz (or more) should be clear liquids  And  32 oz (or more) should be full liquids (see below for examples)  Liquids should not contain sugar, caffeine, or carbonation   Clear Liquids:  Water or Sugar-free flavored water (i.e. Fruit H2O, Propel)  Decaffeinated coffee or tea (sugar-free)  Crystal Lite, Wyler's Lite, Minute Maid Lite  Sugar-free Jell-O  Bouillon or broth  Sugar-free Popsicle: *Less than 20 calories each; Limit 1 per day   Full Liquids:  Protein Shakes/Drinks + 2 choices per day of other full liquids  Full liquids must be:  No More Than 12 grams of Carbs per serving  No More Than 3 grams of Fat per serving  Strained low-fat cream soup  Non-Fat milk  Fat-free Lactaid Milk  Sugar-free yogurt (Dannon Lite & Fit, Greek yogurt)     Clayton Bibles,  MS, RD, LDN Pager: [phone] After Hours Pager: [phone]

## 2015-12-13 NOTE — Progress Notes (Signed)
Patient tolerating shakes and water. Discussed bariatric discharge teaching with both patient and husband. Addressed all questions and concerns. Discharge to home with prescriptions.

## 2015-12-26 ENCOUNTER — Encounter: Payer: 59 | Attending: Surgery | Admitting: Dietician

## 2015-12-26 DIAGNOSIS — K9509 Other complications of gastric band procedure: Secondary | ICD-10-CM | POA: Diagnosis present

## 2015-12-26 NOTE — Progress Notes (Signed)
Bariatric Class:  Appt start time: 1530 end time:  1630.  2 Week Post-Operative Nutrition Class  Patient was seen on 12/26/2015 for Post-[facility]ative Nutrition education at [facility].   Surgery date: 12/11/2015 Surgery type: Sleeve gastrectomy Start weight at [facility]: 217 lbs on 06/05/2015, 236 lbs on 12/04/15 Weight today: 220.4 lbs  Weight change: 18.6 lbs  TANITA  BODY COMP RESULTS  12/04/15 12/26/15   BMI (kg/m^2) 43.2 40.3   Fat Mass (lbs) 117.6 109.6   Fat Free Mass (lbs) 118.2 110.8   Total Body Water (lbs) 86 80.0   The following the learning objectives were met by the patient during this course:  Identifies Phase 3A (Soft, High Proteins) Dietary Goals and will begin from 2 weeks post-operatively to 2 months post-operatively  Identifies appropriate sources of fluids and proteins   States protein recommendations and appropriate sources post-operatively  Identifies the need for appropriate texture modifications, mastication, and bite sizes when consuming solids  Identifies appropriate multivitamin and calcium sources post-operatively  Describes the need for physical activity post-operatively and will follow MD recommendations  States when to call healthcare provider regarding medication questions or post-operative complications  Handouts given during class include:  Phase 3A: Soft, High Protein Diet Handout  Follow-Up Plan: Patient will follow-up at [facility] in 6 weeks for 2 month post-op nutrition visit for diet advancement per MD.

## 2016-01-02 NOTE — Discharge Summary (Signed)
Physician Discharge Summary  Patient ID: Deanna Edwards MRN: [id number] DOB/AGE: [birth year] 56 y.o.  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  Did well. Ready for discharge  Consults: none  Significant Diagnostic Studies: none    Discharge Exam: Blood pressure 120/66, pulse (!) 58, temperature 98.8 F (37.1 C), temperature source Oral, resp. rate 17, height 5\' 2"  (1.575 m), weight 106.4 kg (234 lb 8 oz), last menstrual period 04/06/2014, SpO2 97 %. Incisions OK  Disposition: 01-Home or Self Care  Discharge Instructions    Ambulate hourly while awake    Complete by:  As directed    Call MD for:  difficulty breathing, headache or visual disturbances    Complete by:  As directed    Call MD for:  persistant dizziness or light-headedness    Complete by:  As directed    Call MD for:  persistant nausea and vomiting    Complete by:  As directed    Call MD for:  redness, tenderness, or signs of infection (pain, swelling, redness, odor or green/yellow discharge around incision site)    Complete by:  As directed    Call MD for:  severe uncontrolled pain    Complete by:  As directed    Call MD for:  temperature >101 F    Complete by:  As directed    Diet bariatric full liquid    Complete by:  As directed    Incentive spirometry    Complete by:  As directed    Perform hourly while awake       Medication List    TAKE these medications   acetaminophen 500 MG tablet Commonly known as:  TYLENOL Take 500 mg by mouth every 6 (six) hours as needed for moderate pain (prn).   ferrous sulfate 325 (65 FE) MG tablet Take 325 mg by mouth 2 (two) times daily.    furosemide 20 MG tablet Commonly known as:  LASIX Take 20 mg by mouth as directed. 20 mg in the morning and may repeat one dose if needed for fluid retention.   ibuprofen 200 MG tablet Commonly known as:  ADVIL,MOTRIN Take 200 mg by mouth every 6 (six) hours as needed (for pain.).   multivitamin with minerals Tabs tablet Take 1 tablet by mouth daily.   ondansetron 4 MG disintegrating tablet Commonly known as:  ZOFRAN ODT Take 1 tablet (4 mg total) by mouth every 8 (eight) hours as needed for nausea or vomiting.   oxyCODONE 5 MG/5ML solution Commonly known as:  ROXICODONE Take 5-10 mLs (5-10 mg total) by mouth every 4 (four) hours as needed for moderate pain or severe pain.   pantoprazole 20 MG tablet Commonly known as:  PROTONIX Take 1 tablet (20 mg total) by mouth daily.   valsartan-hydrochlorothiazide 160-25 MG tablet Commonly known as:  DIOVAN-HCT Take 1 tablet by mouth daily. Notes to patient:  Monitor blood pressure daily. Keep a record to take to your PCP as changes may need to be made to your medications.       Follow-up Information    Valarie MerinoMARTIN,Makailyn Mccormick B, MD Follow up on 12/28/2015.   Specialty:  General Surgery Why:  At 1015AM for post op  followup appointment.  Contact information: [address] [place] [address] [phone]           Signed: Valarie MerinoMARTIN,Michele Judy B 01/02/2016, 7:14 AM

## 2016-01-03 ENCOUNTER — Ambulatory Visit (INDEPENDENT_AMBULATORY_CARE_PROVIDER_SITE_OTHER): Payer: 59 | Admitting: Orthopaedic Surgery

## 2016-01-26 ENCOUNTER — Encounter: Payer: Self-pay | Admitting: Dietician

## 2016-01-26 ENCOUNTER — Encounter: Payer: 59 | Attending: Surgery | Admitting: Dietician

## 2016-01-26 DIAGNOSIS — K9509 Other complications of gastric band procedure: Secondary | ICD-10-CM | POA: Diagnosis not present

## 2016-01-26 NOTE — Progress Notes (Signed)
  Follow-up visit:  6.5 Weeks Post-Operative Sleeve gastrectomy Surgery  Medical Nutrition Therapy:  Appt start time: 1025 end time:  1055  Primary concerns today: Post-operative Bariatric Surgery Nutrition Management. Deshawna returns having lost 12 pounds. She reports feeling well. Has had a rough week getting in her protein or water. Busy at work and at home caring for her mother. Beef does not sit well.   Surgery date: 12/11/2015 Surgery type: Sleeve gastrectomy [facility] lbs on 06/05/2015, 236 lbs on 12/04/15 Weight today: 208 lbs Weight change: 12 lbs Total weight lost: 28 lbs   TANITA  BODY COMP RESULTS  12/04/15 12/26/15 01/26/16   BMI (kg/m^2) 43.2 40.3 38   Fat Mass (lbs) 117.6 109.6 99.6   Fat Free Mass (lbs) 118.2 110.8 108.4   Total Body Water (lbs) 86 80.0 78    Preferred Learning Style:   No preference indicated   Learning Readiness:   Ready  24-hr recall: B (8-10AM): most of 1 large scrambled egg with cheese OR Premier shake OR yogurt (6-30g) Snk (AM):   L (PM): 3-4 pieces shrimp (14-21g) Snk (PM): sometimes string cheese or yogurt (6-12g)  D (PM): 2-2.5 oz chicken and sometimes pintos (20g) Snk (PM):   Fluid intake: decaf coffee, water, water with sugar free flavoring, sometimes protein shake (40-50 oz total per patient) Estimated total protein intake: 60-80g  Medications: see list, no longer on Lasix Supplementation: taking  Using straws: no Drinking while eating: usually no Hair loss: none Carbonated beverages: none N/V/D/C: none (BM every "day and a half") Dumping syndrome: none  Recent physical activity:  Plans to start BEL[employer] after Christmas; has been walking at the mall inconsistently  Progress Towards Goal(s):  In progress.  Handouts given during visit include:  Phase 3B lean protein + non starchy vegetables   Nutritional Diagnosis:  [place]-3.3 Overweight/obesity related to past poor dietary habits and physical inactivity as  evidenced by patient w/ recent sleeve gastrectomy surgery following dietary guidelines for continued weight loss.     Intervention:  Nutrition counseling provided.  Teaching Method Utilized:  Visual Auditory Hands on  Barriers to learning/adherence to lifestyle change: none  Demonstrated degree of understanding via:  Teach Back   Monitoring/Evaluation:  Dietary intake, exercise, and body weight. Follow up prn.

## 2016-01-26 NOTE — Patient Instructions (Addendum)
Goals:  Follow Phase 3B: High Protein + Non-Starchy Vegetables  Eat 3-6 small meals/snacks, every 3-5 hrs  Increase lean protein foods to meet 60g goal  Increase fluid intake to 64oz+  Avoid drinking 15 minutes before, during and 30 minutes after eating  Aim for >30 min of physical activity daily  Earn a cup of coffee by getting in 64 oz of caffeine-free fluid   Surgery date: 12/11/2015 Surgery type: Sleeve gastrectomy [facility]: 217 lbs on 06/05/2015, 236 lbs on 12/04/15 Weight today: 208 lbs Weight change: 12 lbs Total weight lost: 28 lbs   TANITA  BODY COMP RESULTS  12/04/15 12/26/15 01/26/16   BMI (kg/m^2) 43.2 40.3 38   Fat Mass (lbs) 117.6 109.6 99.6   Fat Free Mass (lbs) 118.2 110.8 108.4   Total Body Water (lbs) 86 80.0 78

## 2016-02-01 ENCOUNTER — Ambulatory Visit: Payer: 59 | Admitting: Skilled Nursing Facility1

## 2016-02-01 ENCOUNTER — Ambulatory Visit: Payer: 59 | Admitting: Dietician

## 2016-02-16 ENCOUNTER — Ambulatory Visit (INDEPENDENT_AMBULATORY_CARE_PROVIDER_SITE_OTHER): Payer: 59 | Admitting: Orthopaedic Surgery

## 2016-03-08 DIAGNOSIS — M7581 Other shoulder lesions, right shoulder: Secondary | ICD-10-CM | POA: Diagnosis not present

## 2016-03-08 DIAGNOSIS — M19019 Primary osteoarthritis, unspecified shoulder: Secondary | ICD-10-CM | POA: Diagnosis not present

## 2016-03-15 DIAGNOSIS — I10 Essential (primary) hypertension: Secondary | ICD-10-CM | POA: Diagnosis not present

## 2016-03-15 DIAGNOSIS — R609 Edema, unspecified: Secondary | ICD-10-CM | POA: Diagnosis not present

## 2016-03-21 DIAGNOSIS — Z01818 Encounter for other preprocedural examination: Secondary | ICD-10-CM | POA: Diagnosis not present

## 2016-03-22 ENCOUNTER — Ambulatory Visit (INDEPENDENT_AMBULATORY_CARE_PROVIDER_SITE_OTHER): Payer: 59 | Admitting: Orthopaedic Surgery

## 2016-03-25 DIAGNOSIS — M19011 Primary osteoarthritis, right shoulder: Secondary | ICD-10-CM | POA: Diagnosis not present

## 2016-03-25 DIAGNOSIS — S43431A Superior glenoid labrum lesion of right shoulder, initial encounter: Secondary | ICD-10-CM | POA: Diagnosis not present

## 2016-03-25 DIAGNOSIS — G8918 Other acute postprocedural pain: Secondary | ICD-10-CM | POA: Diagnosis not present

## 2016-03-25 DIAGNOSIS — M7581 Other shoulder lesions, right shoulder: Secondary | ICD-10-CM | POA: Diagnosis not present

## 2016-04-02 ENCOUNTER — Ambulatory Visit (INDEPENDENT_AMBULATORY_CARE_PROVIDER_SITE_OTHER): Payer: 59 | Admitting: Orthopaedic Surgery

## 2016-04-02 DIAGNOSIS — R1013 Epigastric pain: Secondary | ICD-10-CM | POA: Diagnosis not present

## 2016-04-02 DIAGNOSIS — R110 Nausea: Secondary | ICD-10-CM | POA: Diagnosis not present

## 2016-04-02 DIAGNOSIS — R61 Generalized hyperhidrosis: Secondary | ICD-10-CM | POA: Diagnosis not present

## 2016-04-03 DIAGNOSIS — R1012 Left upper quadrant pain: Secondary | ICD-10-CM | POA: Diagnosis not present

## 2016-04-05 ENCOUNTER — Other Ambulatory Visit: Payer: Self-pay | Admitting: Family Medicine

## 2016-04-05 ENCOUNTER — Ambulatory Visit (INDEPENDENT_AMBULATORY_CARE_PROVIDER_SITE_OTHER): Payer: 59 | Admitting: Orthopaedic Surgery

## 2016-04-05 DIAGNOSIS — R109 Unspecified abdominal pain: Secondary | ICD-10-CM

## 2016-04-05 DIAGNOSIS — R110 Nausea: Secondary | ICD-10-CM

## 2016-04-09 ENCOUNTER — Ambulatory Visit
Admission: RE | Admit: 2016-04-09 | Discharge: 2016-04-09 | Disposition: A | Discharge status: Home / Self Care | Payer: 59 | Source: Ambulatory Visit | Attending: Family Medicine | Admitting: Family Medicine

## 2016-04-09 DIAGNOSIS — R110 Nausea: Secondary | ICD-10-CM

## 2016-04-09 DIAGNOSIS — R109 Unspecified abdominal pain: Secondary | ICD-10-CM | POA: Diagnosis not present

## 2016-04-26 DIAGNOSIS — M25611 Stiffness of right shoulder, not elsewhere classified: Secondary | ICD-10-CM | POA: Diagnosis not present

## 2016-04-26 DIAGNOSIS — R29898 Other symptoms and signs involving the musculoskeletal system: Secondary | ICD-10-CM | POA: Diagnosis not present

## 2016-04-26 DIAGNOSIS — Z9889 Other specified postprocedural states: Secondary | ICD-10-CM | POA: Diagnosis not present

## 2016-05-21 DIAGNOSIS — Z9889 Other specified postprocedural states: Secondary | ICD-10-CM | POA: Diagnosis not present

## 2016-05-23 DIAGNOSIS — Z9889 Other specified postprocedural states: Secondary | ICD-10-CM | POA: Diagnosis not present

## 2016-09-16 DIAGNOSIS — I10 Essential (primary) hypertension: Secondary | ICD-10-CM | POA: Diagnosis not present

## 2016-09-16 DIAGNOSIS — D509 Iron deficiency anemia, unspecified: Secondary | ICD-10-CM | POA: Diagnosis not present

## 2016-09-16 DIAGNOSIS — R609 Edema, unspecified: Secondary | ICD-10-CM | POA: Diagnosis not present

## 2016-10-29 DIAGNOSIS — I10 Essential (primary) hypertension: Secondary | ICD-10-CM | POA: Diagnosis not present

## 2016-11-24 DIAGNOSIS — Z23 Encounter for immunization: Secondary | ICD-10-CM | POA: Diagnosis not present

## 2017-03-24 DIAGNOSIS — I10 Essential (primary) hypertension: Secondary | ICD-10-CM | POA: Diagnosis not present

## 2017-03-24 DIAGNOSIS — K219 Gastro-esophageal reflux disease without esophagitis: Secondary | ICD-10-CM | POA: Diagnosis not present

## 2017-04-24 DIAGNOSIS — L309 Dermatitis, unspecified: Secondary | ICD-10-CM | POA: Diagnosis not present

## 2017-04-24 DIAGNOSIS — R600 Localized edema: Secondary | ICD-10-CM | POA: Diagnosis not present

## 2017-05-08 DIAGNOSIS — R600 Localized edema: Secondary | ICD-10-CM | POA: Diagnosis not present

## 2017-05-08 DIAGNOSIS — L309 Dermatitis, unspecified: Secondary | ICD-10-CM | POA: Diagnosis not present

## 2017-06-06 DIAGNOSIS — L309 Dermatitis, unspecified: Secondary | ICD-10-CM | POA: Diagnosis not present

## 2017-06-06 DIAGNOSIS — R600 Localized edema: Secondary | ICD-10-CM | POA: Diagnosis not present

## 2017-09-17 DIAGNOSIS — K219 Gastro-esophageal reflux disease without esophagitis: Secondary | ICD-10-CM | POA: Diagnosis not present

## 2017-09-17 DIAGNOSIS — I10 Essential (primary) hypertension: Secondary | ICD-10-CM | POA: Diagnosis not present

## 2017-09-17 DIAGNOSIS — D509 Iron deficiency anemia, unspecified: Secondary | ICD-10-CM | POA: Diagnosis not present

## 2017-09-17 DIAGNOSIS — Z1159 Encounter for screening for other viral diseases: Secondary | ICD-10-CM | POA: Diagnosis not present

## 2017-09-24 IMAGING — RF DG UGI W/ KUB
17 of 19 series · 17 of 19 positions shown · non-contrast
Comparison: 11/29/2008 upper GI.

CLINICAL DATA: 54-year-old female status post laparoscopic gastric
band placement in 9080 with revision on 08/25/2012, presents with
loss of sensation of satiety despite additional fluid to the band.

EXAM:
UPPER GI SERIES WITH KUB
TECHNIQUE: After obtaining a scout radiograph a routine upper GI series was
performed using thin and high density barium.
FLUOROSCOPY TIME:  Fluoroscopy Time (in minutes and seconds): 2
minutes 30 seconds.
Number of Acquired Images:  18

[Series 1: run · 1 of 1 slices shown (1 of 16)]
[im 1/1]
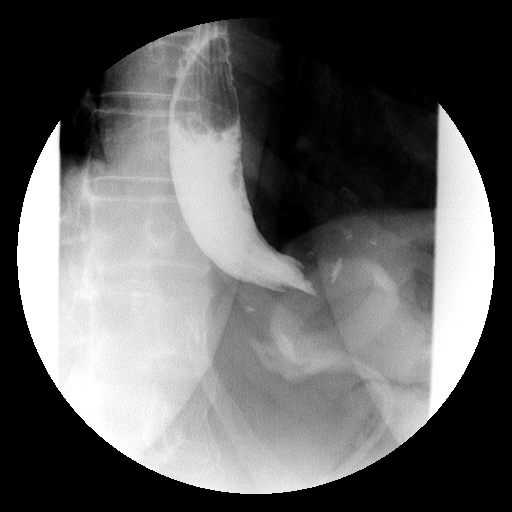

[Series 2: run · 1 of 1 slices shown (2 of 16)]
[im 1/1]
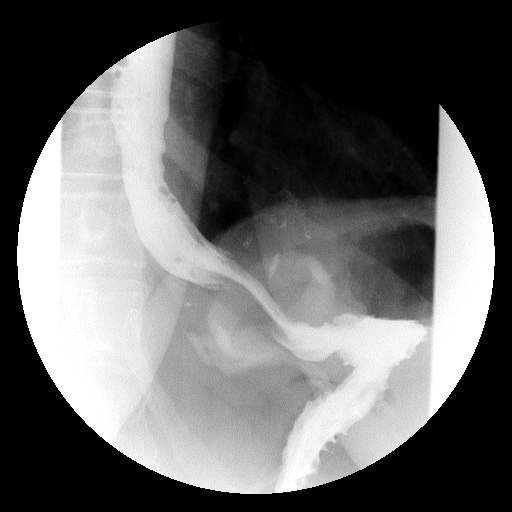

[Series 3: run · 1 of 1 slices shown (3 of 16)]
[im 1/1]
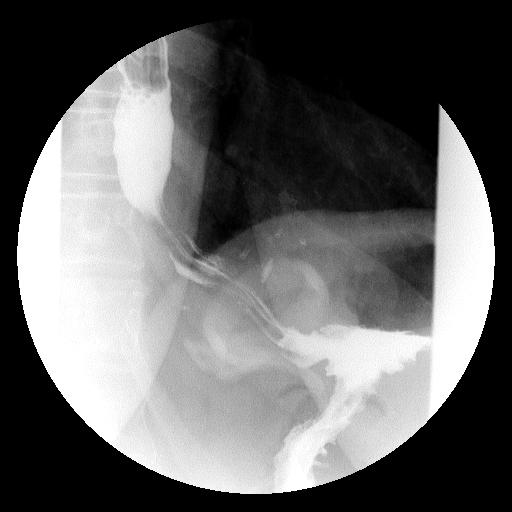

[Series 4: run · 1 of 1 slices shown (4 of 16)]
[im 1/1]
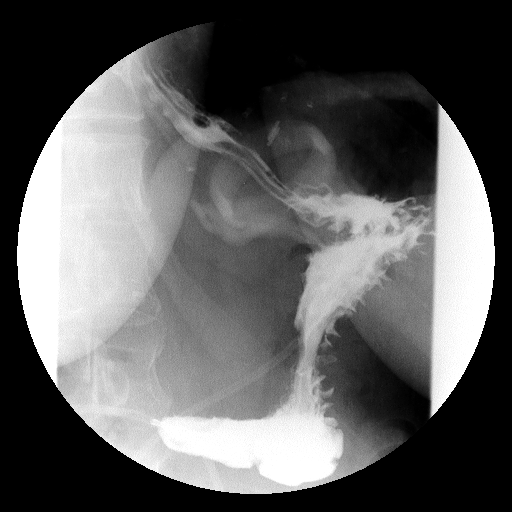

[Series 6: run · 1 of 1 slices shown (5 of 16)]
[im 1/1]
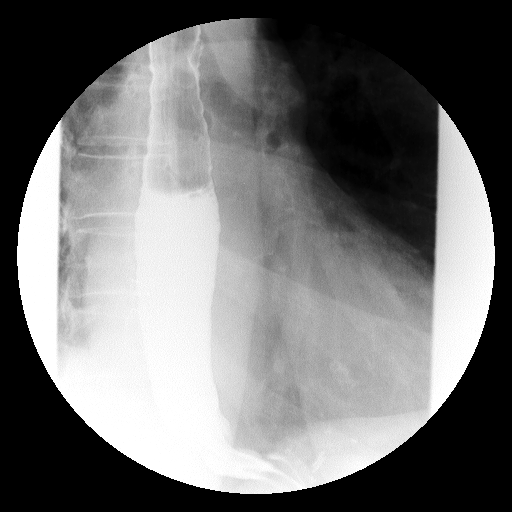

[Series 7: run · 1 of 1 slices shown (6 of 16)]
[im 1/1]
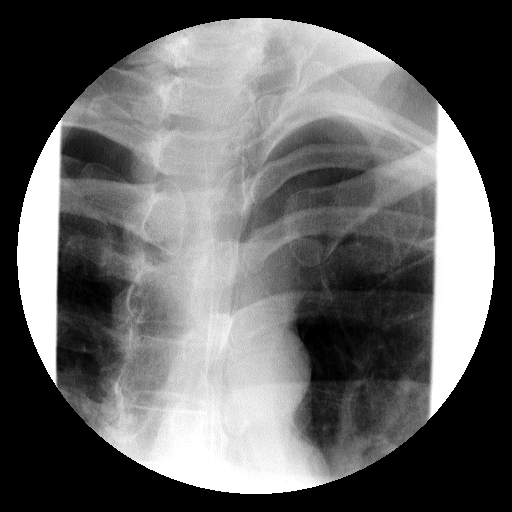

[Series 8: run · 1 of 1 slices shown (7 of 16)]
[im 1/1]
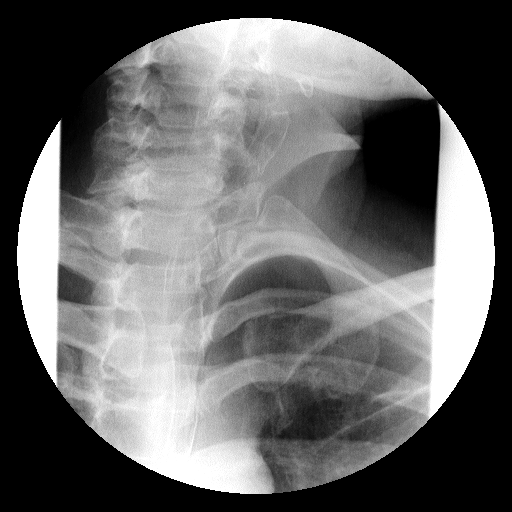

[Series 9: run · 1 of 1 slices shown (8 of 16)]
[im 1/1]
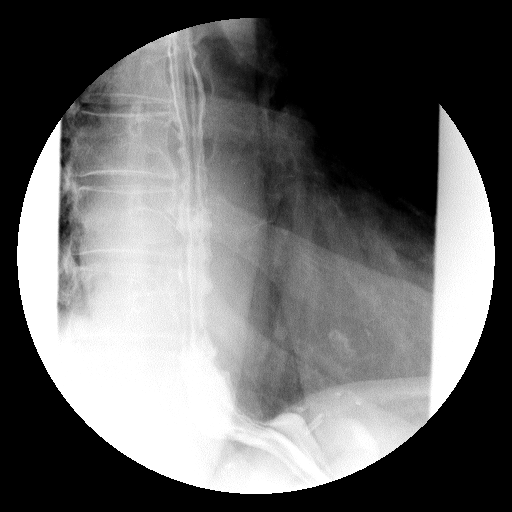

[Series 10: run · 1 of 1 slices shown (9 of 16)]
[im 1/1]
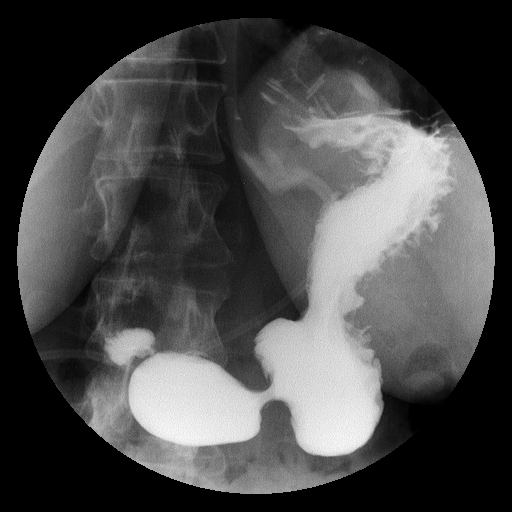

[Series 11: run · 1 of 1 slices shown (10 of 16)]
[im 1/1]
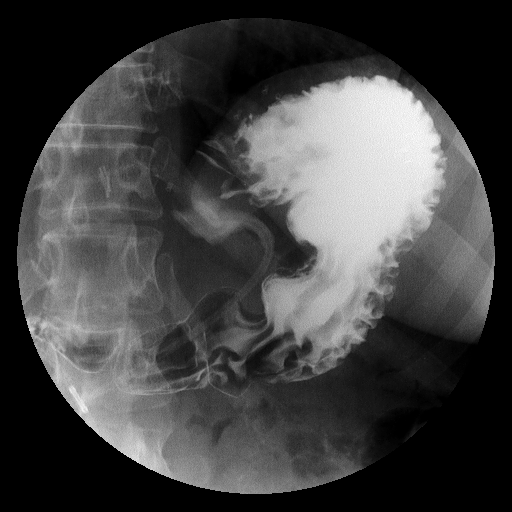

[Series 12: run · 1 of 1 slices shown (11 of 16)]
[im 1/1]
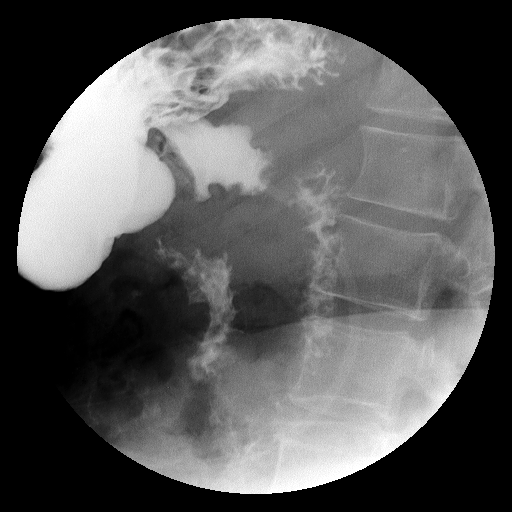

[Series 13: run · 1 of 1 slices shown (12 of 16)]
[im 1/1]
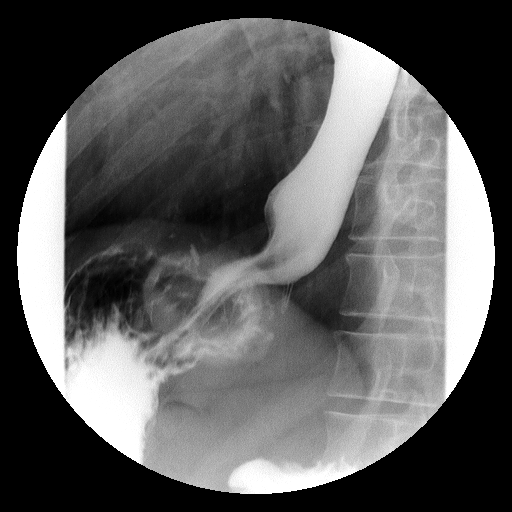

[Series 14: run · 1 of 1 slices shown (13 of 16)]
[im 1/1]
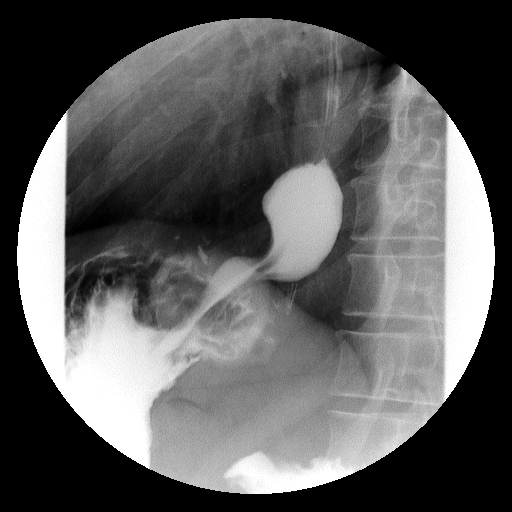

[Series 16: run · 1 of 1 slices shown (14 of 16)]
[im 1/1]
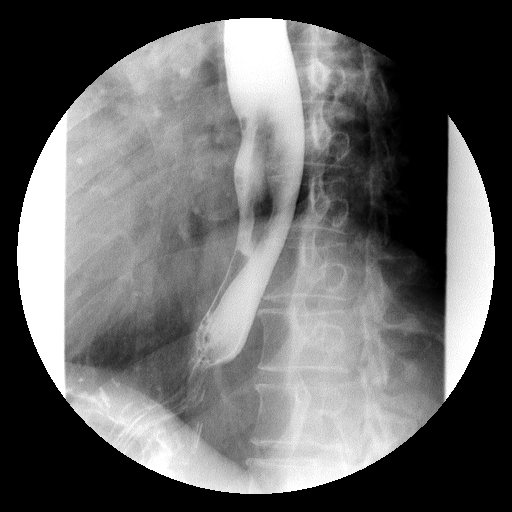

[Series 17: run · 1 of 1 slices shown (15 of 16)]
[im 1/1]
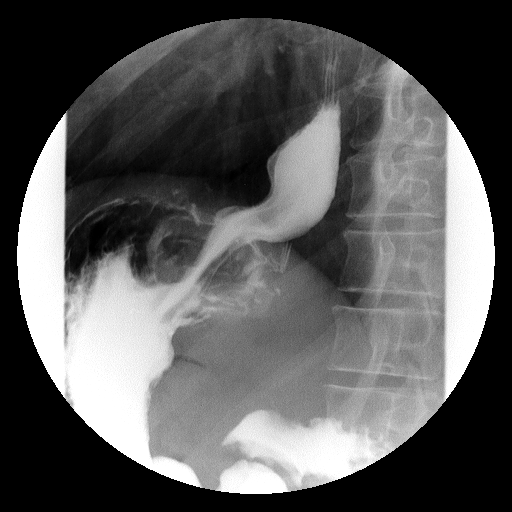

[Series 18: run · 1 of 1 slices shown (16 of 16)]
[im 1/1]
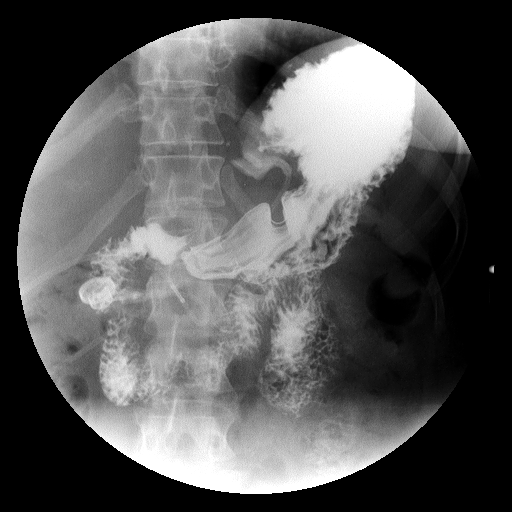

[Series 1001: view not recorded · 0.20mm/px · 1 of 1 slices shown]
[im 1/1]
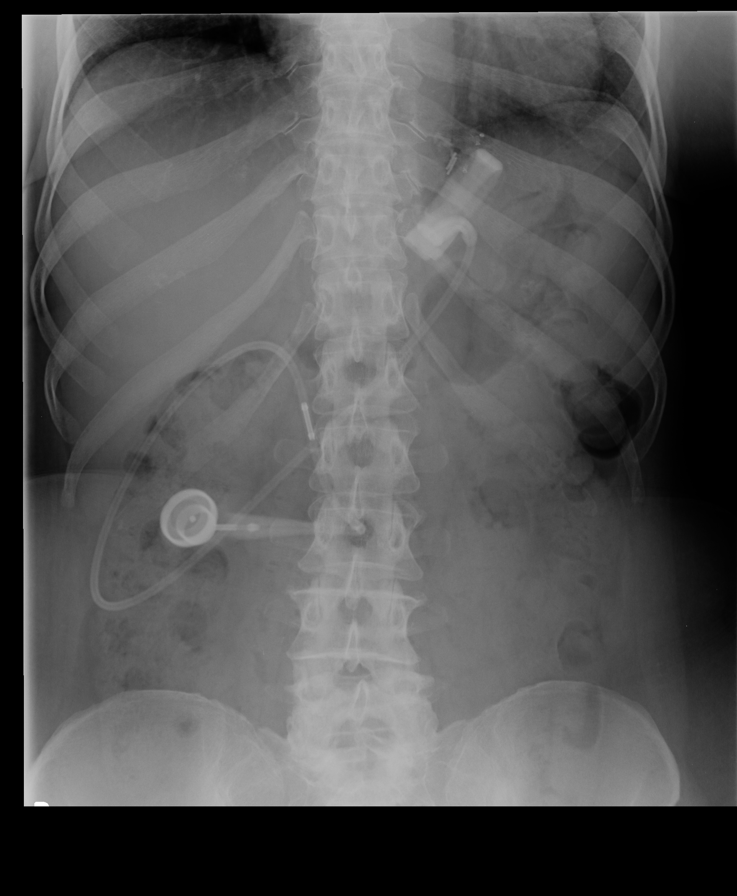

[17 of 19 positions shown; findings below may reference images not displayed]

FINDINGS: Scout radiograph demonstrates well-positioned gastric band in the
left upper quadrant in the expected location just below the
esophagogastric junction, with no evidence of kink or discontinuity
in the tubing connecting to the right-sided port. No dilated small
bowel loops. Mild colonic stool volume. No evidence of pneumatosis
or pneumoperitoneum.

Laparoscopic gastric band appears appropriately positioned in the
gastric cardia region just below the esophagogastric junction.
Swallowed barium contrast traversed the esophagus into the stomach
without delay. There is mild smooth circumferential narrowing of the
gastric cardia by the gastric band. Esophageal distensibility
appears normal. No esophageal mass, stricture or ulcer are detected.
No evidence of a recurrent hiatal hernia. No gastroesophageal reflux
was elicited despite water siphon test. Esophageal motility is
normal. The minimally distended stomach appears grossly normal, with
no evidence of gastric fold thickening, gastric filling defect or
gastric ulcer. The duodenal bulb and sweep are normal in position
and caliber. No duodenal fold thickening, filling defects, ulcers or
strictures. Duodenal jejunal junction is normal in position.
Visualized proximal small bowel loops are normal caliber.
IMPRESSION: 1. Gastric band appears well positioned in the gastric cardia region
just below the esophagogastric junction. No kink or discontinuity in
the gastric band tubing connecting to the right-sided port.
2. Mild circumferential narrowing of the stomach by the gastric
band, with no delay in esophageal emptying into the stomach.
3. No recurrent hiatal hernia. No gastroesophageal reflux elicited.
Normal esophageal motility.
4. No mass, stricture or ulcer detected in the esophagus, stomach or
duodenum.

## 2017-10-28 DIAGNOSIS — E538 Deficiency of other specified B group vitamins: Secondary | ICD-10-CM | POA: Diagnosis not present

## 2017-10-28 DIAGNOSIS — Z9884 Bariatric surgery status: Secondary | ICD-10-CM | POA: Diagnosis not present

## 2017-10-29 DIAGNOSIS — Z6841 Body Mass Index (BMI) 40.0 and over, adult: Secondary | ICD-10-CM | POA: Diagnosis not present

## 2017-11-04 DIAGNOSIS — Z6841 Body Mass Index (BMI) 40.0 and over, adult: Secondary | ICD-10-CM | POA: Diagnosis not present

## 2017-11-04 DIAGNOSIS — Z903 Acquired absence of stomach [part of]: Secondary | ICD-10-CM | POA: Diagnosis not present

## 2017-11-04 DIAGNOSIS — Z9884 Bariatric surgery status: Secondary | ICD-10-CM | POA: Diagnosis not present

## 2017-11-19 DIAGNOSIS — Z7189 Other specified counseling: Secondary | ICD-10-CM | POA: Diagnosis not present

## 2017-11-21 DIAGNOSIS — Z23 Encounter for immunization: Secondary | ICD-10-CM | POA: Diagnosis not present

## 2017-12-09 DIAGNOSIS — Z903 Acquired absence of stomach [part of]: Secondary | ICD-10-CM | POA: Diagnosis not present

## 2017-12-09 DIAGNOSIS — Z6841 Body Mass Index (BMI) 40.0 and over, adult: Secondary | ICD-10-CM | POA: Diagnosis not present

## 2017-12-17 DIAGNOSIS — E538 Deficiency of other specified B group vitamins: Secondary | ICD-10-CM | POA: Diagnosis not present

## 2017-12-17 DIAGNOSIS — Z9884 Bariatric surgery status: Secondary | ICD-10-CM | POA: Diagnosis not present

## 2017-12-17 DIAGNOSIS — Z713 Dietary counseling and surveillance: Secondary | ICD-10-CM | POA: Diagnosis not present

## 2017-12-31 DIAGNOSIS — Z7189 Other specified counseling: Secondary | ICD-10-CM | POA: Diagnosis not present

## 2018-01-15 DIAGNOSIS — Z9884 Bariatric surgery status: Secondary | ICD-10-CM | POA: Diagnosis not present

## 2018-01-15 DIAGNOSIS — E538 Deficiency of other specified B group vitamins: Secondary | ICD-10-CM | POA: Diagnosis not present

## 2018-01-15 DIAGNOSIS — Z903 Acquired absence of stomach [part of]: Secondary | ICD-10-CM | POA: Diagnosis not present

## 2018-01-20 DIAGNOSIS — Z6839 Body mass index (BMI) 39.0-39.9, adult: Secondary | ICD-10-CM | POA: Diagnosis not present

## 2018-01-20 DIAGNOSIS — E669 Obesity, unspecified: Secondary | ICD-10-CM | POA: Diagnosis not present

## 2018-01-20 DIAGNOSIS — Z903 Acquired absence of stomach [part of]: Secondary | ICD-10-CM | POA: Diagnosis not present

## 2018-03-24 DIAGNOSIS — E669 Obesity, unspecified: Secondary | ICD-10-CM | POA: Diagnosis not present

## 2018-03-24 DIAGNOSIS — Z903 Acquired absence of stomach [part of]: Secondary | ICD-10-CM | POA: Diagnosis not present

## 2018-03-24 DIAGNOSIS — Z6838 Body mass index (BMI) 38.0-38.9, adult: Secondary | ICD-10-CM | POA: Diagnosis not present

## 2018-04-01 DIAGNOSIS — Z713 Dietary counseling and surveillance: Secondary | ICD-10-CM | POA: Diagnosis not present

## 2018-04-01 DIAGNOSIS — Z903 Acquired absence of stomach [part of]: Secondary | ICD-10-CM | POA: Diagnosis not present

## 2018-04-01 DIAGNOSIS — Z9884 Bariatric surgery status: Secondary | ICD-10-CM | POA: Diagnosis not present

## 2018-04-08 DIAGNOSIS — Z23 Encounter for immunization: Secondary | ICD-10-CM | POA: Diagnosis not present

## 2018-04-08 DIAGNOSIS — Z0000 Encounter for general adult medical examination without abnormal findings: Secondary | ICD-10-CM | POA: Diagnosis not present

## 2018-04-08 DIAGNOSIS — I10 Essential (primary) hypertension: Secondary | ICD-10-CM | POA: Diagnosis not present

## 2018-04-08 DIAGNOSIS — K219 Gastro-esophageal reflux disease without esophagitis: Secondary | ICD-10-CM | POA: Diagnosis not present

## 2018-04-08 DIAGNOSIS — D509 Iron deficiency anemia, unspecified: Secondary | ICD-10-CM | POA: Diagnosis not present

## 2018-04-14 DIAGNOSIS — Z7189 Other specified counseling: Secondary | ICD-10-CM | POA: Diagnosis not present

## 2018-06-02 DIAGNOSIS — Z6837 Body mass index (BMI) 37.0-37.9, adult: Secondary | ICD-10-CM | POA: Diagnosis not present

## 2018-06-02 DIAGNOSIS — Z903 Acquired absence of stomach [part of]: Secondary | ICD-10-CM | POA: Diagnosis not present

## 2018-06-02 DIAGNOSIS — E669 Obesity, unspecified: Secondary | ICD-10-CM | POA: Diagnosis not present

## 2018-09-02 ENCOUNTER — Encounter (HOSPITAL_COMMUNITY): Payer: Self-pay

## 2019-01-18 ENCOUNTER — Other Ambulatory Visit: Payer: Self-pay | Admitting: Family Medicine

## 2019-01-18 DIAGNOSIS — Z1231 Encounter for screening mammogram for malignant neoplasm of breast: Secondary | ICD-10-CM

## 2019-06-23 ENCOUNTER — Encounter (HOSPITAL_COMMUNITY): Payer: Self-pay

## 2022-04-27 ENCOUNTER — Encounter (HOSPITAL_BASED_OUTPATIENT_CLINIC_OR_DEPARTMENT_OTHER): Payer: Self-pay | Admitting: Emergency Medicine

## 2022-04-27 ENCOUNTER — Emergency Department (HOSPITAL_BASED_OUTPATIENT_CLINIC_OR_DEPARTMENT_OTHER)
Admission: EM | Admit: 2022-04-27 | Discharge: 2022-04-27 | Disposition: A | Discharge status: Home / Self Care | Payer: 59 | Attending: Emergency Medicine | Admitting: Emergency Medicine

## 2022-04-27 ENCOUNTER — Other Ambulatory Visit: Payer: Self-pay

## 2022-04-27 ENCOUNTER — Emergency Department (HOSPITAL_BASED_OUTPATIENT_CLINIC_OR_DEPARTMENT_OTHER): Payer: 59

## 2022-04-27 DIAGNOSIS — I10 Essential (primary) hypertension: Secondary | ICD-10-CM | POA: Insufficient documentation

## 2022-04-27 DIAGNOSIS — Z79899 Other long term (current) drug therapy: Secondary | ICD-10-CM | POA: Diagnosis not present

## 2022-04-27 DIAGNOSIS — R42 Dizziness and giddiness: Secondary | ICD-10-CM

## 2022-04-27 LAB — BASIC METABOLIC PANEL
Anion gap: 8 (ref 5–15)
BUN: 6 mg/dL — ABNORMAL LOW (ref 8–23)
CO2: 28 mmol/L (ref 22–32)
Calcium: 9.3 mg/dL (ref 8.9–10.3)
Chloride: 105 mmol/L (ref 98–111)
Creatinine, Ser: 0.74 mg/dL (ref 0.44–1.00)
GFR, Estimated: >60 mL/min (ref >60)
Glucose, Bld: 90 mg/dL (ref 70–99)
Potassium: 4.2 mmol/L (ref 3.5–5.1)
Sodium: 141 mmol/L (ref 135–145)

## 2022-04-27 LAB — CBC
HCT: 39.6 % (ref 36.0–46.0)
Hemoglobin: 13 g/dL (ref 12.0–15.0)
MCH: 29.3 pg (ref 26.0–34.0)
MCHC: 32.8 g/dL (ref 30.0–36.0)
MCV: 89.4 fL (ref 80.0–100.0)
Platelets: 213 10*3/uL (ref 150–400)
RBC: 4.43 MIL/uL (ref 3.87–5.11)
RDW: 14.6 % (ref 11.5–15.5)
WBC: 3.8 10*3/uL — ABNORMAL LOW (ref 4.0–10.5)
nRBC: 0 % (ref 0.0–0.2)

## 2022-04-27 LAB — CBG MONITORING, ED
Glucose-Capillary: 83 mg/dL (ref 70–99)
Glucose-Capillary: 89 mg/dL (ref 70–99)

## 2022-04-27 MED ORDER — IOHEXOL 350 MG/ML SOLN
100.0000 mL | Freq: Once | INTRAVENOUS | Status: AC | PRN
  Administered 2022-04-27: 75 mL via INTRAVENOUS

## 2022-04-27 MED ORDER — LACTATED RINGERS IV BOLUS
1000.0000 mL | Freq: Once | INTRAVENOUS | Status: AC
  Administered 2022-04-27: 1000 mL via INTRAVENOUS

## 2022-04-27 MED ORDER — ONDANSETRON HCL 4 MG PO TABS: 4.0000 mg | ORAL_TABLET | Freq: Four times a day (QID) | ORAL | 0 refills | Status: AC

## 2022-04-27 NOTE — ED Notes (Signed)
Pt ambulated to the restroom without assistance. Pt stated she did alright but still got a little light headed/dizzy.

## 2022-04-27 NOTE — ED Triage Notes (Signed)
Pt arrived POV with husband. Pt reports yesterday morning at approx 9a she started to have dizziness, nausea, and difficulty walking with blurred vision. Pt reports she was evaluated yesterday afternoon at [facility] Pt states s/s have been ongoing consistently for 24 hours. Pt states she had s/s briefly Tuesday that subsided.   Pt caox4. VAN neg. No obvious weakness in extremities. No facial droop, slurred speech, or ataxia in triage.

## 2022-04-27 NOTE — Discharge Instructions (Signed)
You were seen in the emergency department for your dizziness.  It is unclear what exactly is causing your symptoms however your workup today showed no signs of abnormal heart rhythms, severe dehydration or signs of stroke.  You should make sure that you are drinking plenty of fluids and can try compression stockings.  You can try over-the-counter Dramamine though this can make you drowsy so I recommend taking it before bed or do not take it before driving.  You should follow-up with your primary doctor over the next few days to have your symptoms rechecked.  You should return to the emergency department if you pass out, you are unable to walk, you have slurred speech or numbness or weakness on one side of the body compared to the other or if you have any other new or concerning symptoms.

## 2022-04-27 NOTE — ED Provider Notes (Signed)
[facility] Provider Note   CSN: [id number] Arrival date & time: 04/27/22  [id number]     History  Chief Complaint  Patient presents with   Dizziness    Deanna Edwards is a 62 y.o. female.  Patient is a 62 year old female with a past medical history of hypertension presenting to the emergency department for dizziness.  Patient states that she had a brief episode of dizziness on Tuesday that resolved on its own and she has had intermittent dizziness since yesterday.  She states that she was seen by her primary doctor and had a normal EKG and was recommended to come to the ER for further evaluation.  She states that she has been feeling off balance and had some associated nausea.  She states that she feels a lightheaded type of dizzy like she might pass out.  She denies any chest pain or shortness of breath.  She denies any numbness or weakness.  The history is provided by the patient and the spouse.  Dizziness      Home Medications Prior to Admission medications   Medication Sig Start Date End Date Taking? Authorizing Provider  ondansetron (ZOFRAN) 4 MG tablet Take 1 tablet (4 mg total) by mouth every 6 (six) hours. 04/27/22  Yes Leanord Asal K, DO  acetaminophen (TYLENOL) 500 MG tablet Take 500 mg by mouth every 6 (six) hours as needed for moderate pain (prn).    [provider]  ferrous sulfate 325 (65 FE) MG tablet Take 325 mg by mouth 2 (two) times daily. 11/13/15   [provider]  furosemide (LASIX) 20 MG tablet Take 20 mg by mouth as directed. 20 mg in the morning and may repeat one dose if needed for fluid retention. 10/27/15   [provider]  ibuprofen (ADVIL,MOTRIN) 200 MG tablet Take 200 mg by mouth every 6 (six) hours as needed (for pain.).    [provider]  Multiple Vitamin (MULTIVITAMIN WITH MINERALS) TABS Take 1 tablet by mouth daily.    [provider]  ondansetron (ZOFRAN ODT) 4  MG disintegrating tablet Take 1 tablet (4 mg total) by mouth every 8 (eight) hours as needed for nausea or vomiting. 12/13/15   Johnathan Hausen, MD  oxyCODONE (ROXICODONE) 5 MG/5ML solution Take 5-10 mLs (5-10 mg total) by mouth every 4 (four) hours as needed for moderate pain or severe pain. 12/13/15   Johnathan Hausen, MD  pantoprazole (PROTONIX) 20 MG tablet Take 1 tablet (20 mg total) by mouth daily. 12/13/15   Johnathan Hausen, MD  valsartan-hydrochlorothiazide (DIOVAN-HCT) 160-25 MG tablet Take 1 tablet by mouth daily. 11/10/15   [provider]      Allergies    Codeine and Sulfa antibiotics    Review of Systems   Review of Systems  Neurological:  Positive for dizziness.    Physical Exam Updated Vital Signs BP 134/78 (BP Location: Right Arm)   Pulse 92   Temp 98.3 F (36.8 C) (Oral)   Resp 17   Ht 5\' 1"  (1.549 m)   Wt 78.9 kg   SpO2 100%   BMI 32.88 kg/m  Physical Exam Vitals and nursing note reviewed.  Constitutional:      General: She is not in acute distress.    Appearance: Normal appearance.  HENT:     Head: Normocephalic and atraumatic.     Nose: Nose normal.     Mouth/Throat:     Mouth: Mucous membranes are moist.  Pharynx: Oropharynx is clear.  Eyes:     Extraocular Movements: Extraocular movements intact.     Conjunctiva/sclera: Conjunctivae normal.     Pupils: Pupils are equal, round, and reactive to light.  Cardiovascular:     Rate and Rhythm: Normal rate and regular rhythm.     Heart sounds: Normal heart sounds.  Pulmonary:     Effort: Pulmonary effort is normal.     Breath sounds: Normal breath sounds.  Abdominal:     General: Abdomen is flat.     Palpations: Abdomen is soft.     Tenderness: There is no abdominal tenderness.  Musculoskeletal:        General: Normal range of motion.     Cervical back: Normal range of motion and neck supple.  Skin:    General: Skin is warm and dry.  Neurological:     General: No focal deficit present.      Mental Status: She is alert and oriented to person, place, and time.     Cranial Nerves: No cranial nerve deficit.     Sensory: No sensory deficit.     Motor: No weakness.     Coordination: Coordination abnormal (Normal finger to nose bilaterally, positive Romberg's).  Psychiatric:        Mood and Affect: Mood normal.        Behavior: Behavior normal.     ED Results / Procedures / Treatments   Labs (all labs ordered are listed, but only abnormal results are displayed) Labs Reviewed  BASIC METABOLIC PANEL - Abnormal; Notable for the following components:      Result Value   BUN 6 (*)    All other components within normal limits  CBC - Abnormal; Notable for the following components:   WBC 3.8 (*)    All other components within normal limits  CBG MONITORING, ED  CBG MONITORING, ED    EKG EKG Interpretation  Date/Time:  Saturday April 27 2022 09:40:43 EDT Ventricular Rate:  71 PR Interval:  160 QRS Duration: 103 QT Interval:  364 QTC Calculation: 396 R Axis:   66 Text Interpretation: Sinus rhythm No significant change since last tracing Confirmed by Leanord Asal ([id number]) on 04/27/2022 9:45:42 AM  Radiology CT Angio Head Neck W WO CM  Result Date: 04/27/2022 CLINICAL DATA:  Stroke, determine embolic source. Dizziness and nausea with difficulty walking and blurred vision EXAM: CT ANGIOGRAPHY HEAD AND NECK TECHNIQUE: Multidetector CT imaging of the head and neck was performed using the standard protocol during bolus administration of intravenous contrast. Multiplanar CT image reconstructions and MIPs were obtained to evaluate the vascular anatomy. Carotid stenosis measurements (when applicable) are obtained utilizing NASCET criteria, using the distal internal carotid diameter as the denominator. RADIATION DOSE REDUCTION: This exam was performed according to the departmental dose-optimization program which includes automated exposure control, adjustment of the mA and/or kV  according to patient size and/or use of iterative reconstruction technique. CONTRAST:  61mL OMNIPAQUE IOHEXOL 350 MG/ML SOLN COMPARISON:  None Available. FINDINGS: CT HEAD FINDINGS Brain: No evidence of acute infarction, hemorrhage, hydrocephalus, extra-axial collection or mass lesion/mass effect. Vascular: No hyperdense vessel or unexpected calcification. Skull: Normal. Negative for fracture or focal lesion. Sinuses/Orbits: No acute finding. Review of the MIP images confirms the above findings CTA NECK FINDINGS Aortic arch: Normal.  Three vessel branching Right carotid system: No evidence of dissection, stenosis (50% or greater), or occlusion. Left carotid system: No evidence of dissection, stenosis (50% or greater), or occlusion. Vertebral arteries:  No proximal subclavian or vertebral stenosis. The left vertebral artery is dominant. Skeleton: Ordinary cervical spine degeneration which is generalized. Other neck: Negative. Upper chest: Clear apical lungs Review of the MIP images confirms the above findings CTA HEAD FINDINGS Anterior circulation: No significant stenosis, proximal occlusion, aneurysm, or vascular malformation. Posterior circulation: Left dominant vertebral artery. The vertebral and basilar arteries are smoothly contoured and widely patent. No branch occlusion, beading, or aneurysm. Venous sinuses: Unremarkable Anatomic variants: None significant Review of the MIP images confirms the above findings IMPRESSION: Head CT: Normal. CTA: No emergent finding. No notable atherosclerosis or major vessel stenosis. Electronically Signed   By: Jorje Guild M.D.   On: 04/27/2022 11:16    Procedures Procedures    Medications Ordered in ED Medications  lactated ringers bolus 1,000 mL (0 mLs Intravenous Stopped 04/27/22 1321)  iohexol (OMNIPAQUE) 350 MG/ML injection 100 mL (75 mLs Intravenous Contrast Given 04/27/22 1042)    ED Course/ Medical Decision Making/ A&P Clinical Course as of 04/27/22 1408   Sat Apr 27, 2022  1122 Orthostatics negative, labs within normal range. CTA Head/neck without obvious stroke or occlusions. [VK]  1401 Upon reassessment, the patient had improvement of her symptoms and she is able to ambulate steadily.  She is stable for discharge home with primary care follow-up and was given strict return precautions. [VK]    Clinical Course User Index [VK] Kemper Durie, DO                             Medical Decision Making This patient presents to the ED with chief complaint(s) of dizziness with pertinent past medical history of HTN which further complicates the presenting complaint. The complaint involves an extensive differential diagnosis and also carries with it a high risk of complications and morbidity.    The differential diagnosis includes arrhythmia, anemia, electrolyte abnormality, dehydration, orthostatic hypotension, considering vertigo though patient has no nystagmus on exam, no focal neurologic deficits on sides of mildly unsteady on Romberg  Additional history obtained: Additional history obtained from spouse Records reviewed Primary Care Documents  ED Course and Reassessment: Patient is awake alert well-appearing on arrival without acute distress.  She has no obvious signs of stroke however due to her positive Romberg's and risk factors she will have a CTA performed to evaluate for subacute stroke.  She will be treated with IV fluids and have EKG and labs performed and will be closely reassessed.  Independent labs interpretation:  The following labs were independently interpreted: within normal range  Independent visualization of imaging: - I independently visualized the following imaging with scope of interpretation limited to determining acute life threatening conditions related to emergency care: CTA Head/Neck, which revealed no acute disease  Consultation: - Consulted or discussed management/test interpretation w/ external professional:  N/A  Consideration for admission or further workup: Patient has no emergent conditions requiring admission or further work-up at this time and is stable for discharge home with primary care follow-up  Social Determinants of health: N/A    Amount and/or Complexity of Data Reviewed Labs: ordered. Radiology: ordered.  Risk Prescription drug management.          Final Clinical Impression(s) / ED Diagnoses Final diagnoses:  Dizziness    Rx / DC Orders ED Discharge Orders          Ordered    ondansetron (ZOFRAN) 4 MG tablet  Every 6 hours  04/27/22 [place], [place], DO 04/27/22 1408

## 2022-05-02 ENCOUNTER — Ambulatory Visit: Payer: 59 | Attending: Physician Assistant | Admitting: Physical Therapy

## 2022-05-02 ENCOUNTER — Encounter: Payer: Self-pay | Admitting: Physical Therapy

## 2022-05-02 ENCOUNTER — Other Ambulatory Visit: Payer: Self-pay

## 2022-05-02 DIAGNOSIS — R42 Dizziness and giddiness: Secondary | ICD-10-CM | POA: Insufficient documentation

## 2022-05-02 DIAGNOSIS — R2681 Unsteadiness on feet: Secondary | ICD-10-CM | POA: Diagnosis present

## 2022-05-02 NOTE — Therapy (Signed)
OUTPATIENT PHYSICAL THERAPY VESTIBULAR EVALUATION     Patient Name: Deanna Edwards MRN: [id number] DOB:12/25/1960, 62 y.o., female Today's Date: 05/02/2022  END OF SESSION:  PT End of Session - 05/02/22 1336     Visit Number 1    Number of Visits 13    Date for PT Re-Evaluation 06/13/22    Authorization Type [employer]    Authorization Time Period 05/02/22 to 06/13/22    PT Start Time 1230    PT Stop Time 1316    PT Time Calculation (min) 46 min    Activity Tolerance Patient tolerated treatment well    Behavior During Therapy WFL for tasks assessed/performed             Past Medical History:  Diagnosis Date   Arthritis    Dyspnea    states unchanged   GERD (gastroesophageal reflux disease)    no issue since lap band surgery   Hx of laparoscopic gastric banding 08-21-12    10'10   Hypertension    Pneumonia    PONV (postoperative nausea and vomiting)    Sleep apnea    no CPAP   Past Surgical History:  Procedure Laterality Date   CERVICAL CONIZATION W/BX     GASTRIC BANDING PORT REVISION N/A 08/25/2012   Procedure: GASTRIC BANDING PORT REVISION;  Surgeon: Pedro Earls, MD;  Location: [facility];  Service: General;  Laterality: N/A;  Lap Band Port Replacement   HERNIA REPAIR     LAPAROSCOPIC GASTRIC BAND REMOVAL WITH LAPAROSCOPIC GASTRIC SLEEVE RESECTION N/A 12/11/2015   Procedure: LAPAROSCOPIC GASTRIC BAND REMOVAL WITH LAPAROSCOPIC GASTRIC SLEEVE RESECTION WITH UPPER ENDO;  Surgeon: Johnathan Hausen, MD;  Location: [facility];  Service: General;  Laterality: N/A;   LAPAROSCOPIC GASTRIC BANDING     hiatal hernia repair also   TUBAL LIGATION     Patient Active Problem List   Diagnosis Date Noted   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 Oct 2017 12/11/2015   Impingement syndrome of right shoulder 12/06/2015   Fitting and adjustment of gastric lap band 11/27/2012   Lapband APL April 2010 + HH repair 07/10/2012    PCP: Maury Dus MD  REFERRING  PROVIDER: Traci Sermon, PA-C  REFERRING DIAG:  H81.10 (ICD-10-CM) - BPPV (benign paroxysmal positional vertigo)    THERAPY DIAG:  Dizziness and giddiness  Unsteadiness on feet  ONSET DATE: 04/30/2022  Rationale for Evaluation and Treatment: Rehabilitation  SUBJECTIVE:   SUBJECTIVE STATEMENT:  Dizziness started not this past Tuesday but the week before, out of the blue I was at work and things started going out of focus, the more out of focus things got the more nauseous I got. Things started spinning. I work from home and my husband had to help me from my office area to the bedroom. The next day it was a little better. I don't know how to describe it, if I honestly had to describe what I'm feeling right now, it would be this real calm wave. If I'm not staying focused or looking around while I'm moving, the lightheadedness comes back but not as bad as it was originally. There have been a few times that if a wall were not close to me to hold onto, I would have fallen.   Pt accompanied by: self  PERTINENT HISTORY: Deanna Edwards is a 62 y.o. female.   Patient is a 62 year old female with a past medical history of hypertension presenting to the emergency department for dizziness.  Patient  states that she had a brief episode of dizziness on Tuesday that resolved on its own and she has had intermittent dizziness since yesterday.  She states that she was seen by her primary doctor and had a normal EKG and was recommended to come to the ER for further evaluation.  She states that she has been feeling off balance and had some associated nausea.  She states that she feels a lightheaded type of dizzy like she might pass out.  She denies any chest pain or shortness of breath.  She denies any numbness or weakness.   The history is provided by the patient and the spouse.  Dizziness  PAIN:  Are you having pain? Yes: NPRS scale: 5/10 Pain location: headache  Pain description: headache   Aggravating factors: looking around while moving  Relieving factors: sitting still and not looking at anything   Dizziness 6-7/10  PRECAUTIONS: Fall  WEIGHT BEARING RESTRICTIONS: No  FALLS: Has patient fallen in last 6 months? No  LIVING ENVIRONMENT: Lives with: lives with their spouse Lives in: House/apartment Stairs: no steps  Has following equipment at home:  BP cuff   PLOF: Independent, Independent with basic ADLs, Independent with gait, and Independent with transfers  PATIENT GOALS: get rid of dizziness, be able to focus between 2 computer screens for work    OBJECTIVE:   DIAGNOSTIC FINDINGS: CTA HEAD FINDINGS   Anterior circulation: No significant stenosis, proximal occlusion, aneurysm, or vascular malformation.   Posterior circulation: Left dominant vertebral artery. The vertebral and basilar arteries are smoothly contoured and widely patent. No branch occlusion, beading, or aneurysm.   Venous sinuses: Unremarkable   Anatomic variants: None significant   Review of the MIP images confirms the above findings   IMPRESSION: Head CT:   Normal.  COGNITION: Overall cognitive status: Within functional limits for tasks assessed   SENSATION: Not tested  EDEMA:    MUSCLE TONE:    DTRs:    POSTURE:    Cervical ROM:    Active A/PROM (deg) eval  Flexion   Extension   Right lateral flexion   Left lateral flexion   Right rotation   Left rotation   (Blank rows = not tested)  STRENGTH:   LOWER EXTREMITY MMT:   MMT Right eval Left eval  Hip flexion    Hip abduction    Hip adduction    Hip internal rotation    Hip external rotation    Knee flexion    Knee extension    Ankle dorsiflexion    Ankle plantarflexion    Ankle inversion    Ankle eversion    (Blank rows = not tested)  BED MOBILITY:    TRANSFERS:   RAMP:   CURB:   GAIT:   FUNCTIONAL TESTS:    PATIENT SURVEYS:  FOTO 43, predicted 60  VESTIBULAR  ASSESSMENT:  GENERAL OBSERVATION: seems anxious    SYMPTOM BEHAVIOR:  Subjective history: see above   Non-Vestibular symptoms: changes in vision  Type of dizziness: Lightheadedness/Faint, "World moves", and like being on a boat   Frequency: all the time   Duration: present unless she is completely still   Aggravating factors: Induced by motion: bending down to the ground and turning head quickly and Worse outside or in busy environment  Relieving factors: head stationary, slow movements, and avoid busy/distracting environments  Progression of symptoms: better  OCULOMOTOR EXAM:  Ocular Alignment: normal  Ocular ROM: No Limitations  Spontaneous Nystagmus: absent  Gaze-Induced Nystagmus: absent  Smooth Pursuits: intact  Saccades: intact  Convergence/Divergence:   FRENZEL - FIXATION SUPRESSED:     VESTIBULAR - OCULAR REFLEX:   Slow VOR: + right one time, otherwise WNL   VOR Cancellation: Normal  Head-Impulse Test:   Dynamic Visual Acuity:    POSITIONAL TESTING: Right Dix-Hallpike: no nystagmus but symptomatic  Left Dix-Hallpike: no nystagmus  MOTION SENSITIVITY:  Motion Sensitivity Quotient Intensity: 0 = none, 1 = Lightheaded, 2 = Mild, 3 = Moderate, 4 = Severe, 5 = Vomiting  Intensity  1. Sitting to supine 2  2. Supine to L side 3  3. Supine to R side 3  4. Supine to sitting 2  5. L Hallpike-Dix 2  6. Up from L  2  7. R Hallpike-Dix 3  8. Up from R  3  9. Sitting, head tipped to L knee 2  10. Head up from L knee 3  11. Sitting, head tipped to R knee 2  12. Head up from R knee 3  13. Sitting head turns x5 3  14.Sitting head nods x5 3  15. In stance, 180 turn to L  2  16. In stance, 180 turn to R 3    ORTHOSTATICS:   Supine: 132/91 HR 72BPM  Sitting 130/92 HR 82BPM Standing: 124/96 91BPM Standing x3 minutes 141/110 HR 94 (questionable accuracy of cuff)  FUNCTIONAL GAIT:    VESTIBULAR TREATMENT:                                                                                                    DATE:   Eval  Objective measures + appropriate education  Symptomatic with R DH but no nystagmus noted, went ahead and treated with R Epley     PATIENT EDUCATION: Education details: exam findings, anatomy/function of inner ear/vestibular system, POC, holding off on HEP until we assess how she felt after Epley  Person educated: Patient Education method: Explanation Education comprehension: verbalized understanding  HOME EXERCISE PROGRAM:  GOALS: Goals reviewed with patient? Yes  SHORT TERM GOALS: Target date: 05/23/2022    Will be compliant with appropriate progressive HEP  Baseline: Goal status: INITIAL  2.  Dizziness to be no more than 3/10 at worst  Baseline:  Goal status: INITIAL   LONG TERM GOALS: Target date: 06/13/2022    Dizziness to have completely resolved  Baseline:  Goal status: INITIAL  2.  Will score at least 22/24 on DGI  Baseline:  Goal status: INITIAL  3.  Will be independent in vestibular management techniques in case she has recurrence of symptoms  Baseline:  Goal status: INITIAL    ASSESSMENT:  CLINICAL IMPRESSION: Patient is a 62 y.o. F who was seen today for physical therapy evaluation and treatment for BPPV. I was unable to elicit nystagmus with Dix-Hallpike today but she was more symptomatic on the right, went ahead and treated with R Epley without significant improvement in symptoms. Orthostatics negative, did have one elevated pressure at end of testing but suspect this may have been due to cuff error more than a true reading as other readings  were OK, educated her to recheck BP when she arrives home. Did not note significant impairment in VOR reflex today. As of now, suspect that she may be experiencing cupulolithiasis especially given persistence of her dizziness. Will f/u on how she felt after Epley at next session, will also assess DGI and consider Brandt-Daroff if she is not feeling much better.    OBJECTIVE IMPAIRMENTS: Abnormal gait, decreased balance, and decreased coordination.   ACTIVITY LIMITATIONS: squatting, stairs, reach over head, hygiene/grooming, and locomotion level  PARTICIPATION LIMITATIONS: driving, shopping, community activity, and occupation  PERSONAL FACTORS: Age, Behavior pattern, Fitness, Past/current experiences, and Time since onset of injury/illness/exacerbation are also affecting patient's functional outcome.   REHAB POTENTIAL: Good  CLINICAL DECISION MAKING: Stable/uncomplicated  EVALUATION COMPLEXITY: Low   PLAN:  PT FREQUENCY: 2x/week  PT DURATION: 6 weeks  PLANNED INTERVENTIONS: Therapeutic exercises, Therapeutic activity, Neuromuscular re-education, Balance training, Gait training, Patient/Family education, Self Care, Joint mobilization, Vestibular training, Canalith repositioning, and Re-evaluation  PLAN FOR NEXT SESSION: how is she feeling? Consider Brandt-Daroff if she is not much better, needs DGI and HEP   Deniece Ree PT DPT PN2

## 2022-05-07 ENCOUNTER — Encounter: Payer: Self-pay | Admitting: Physical Therapy

## 2022-05-07 ENCOUNTER — Ambulatory Visit: Payer: 59 | Admitting: Physical Therapy

## 2022-05-07 VITALS — BP 142/95 | HR 107

## 2022-05-07 DIAGNOSIS — R42 Dizziness and giddiness: Secondary | ICD-10-CM

## 2022-05-07 DIAGNOSIS — R2681 Unsteadiness on feet: Secondary | ICD-10-CM

## 2022-05-07 NOTE — Therapy (Signed)
OUTPATIENT PHYSICAL THERAPY VESTIBULAR TREATMENT   Patient Name: Deanna Edwards MRN: [id number] DOB:Feb 05, 1961, 62 y.o., female Today's Date: 05/07/2022  END OF SESSION:  PT End of Session - 05/07/22 1535     Visit Number 2    Number of Visits 13    Date for PT Re-Evaluation 06/13/22    Authorization Type [employer]    Authorization Time Period 05/02/22 to 06/13/22    PT Start Time 1535    PT Stop Time [id number]    PT Time Calculation (min) 42 min    Equipment Utilized During Treatment --   performed at mat level, no balance changes, recommend gait belt when assessing higher level balance tasks   Activity Tolerance Patient tolerated treatment well    Behavior During Therapy WFL for tasks assessed/performed             Past Medical History:  Diagnosis Date   Arthritis    Dyspnea    states unchanged   GERD (gastroesophageal reflux disease)    no issue since lap band surgery   Hx of laparoscopic gastric banding 08-21-12    10'10   Hypertension    Pneumonia    PONV (postoperative nausea and vomiting)    Sleep apnea    no CPAP   Past Surgical History:  Procedure Laterality Date   CERVICAL CONIZATION W/BX     GASTRIC BANDING PORT REVISION N/A 08/25/2012   Procedure: GASTRIC BANDING PORT REVISION;  Surgeon: Deanna Earls, MD;  Location: [facility];  Service: General;  Laterality: N/A;  Lap Band Port Replacement   HERNIA REPAIR     LAPAROSCOPIC GASTRIC BAND REMOVAL WITH LAPAROSCOPIC GASTRIC SLEEVE RESECTION N/A 12/11/2015   Procedure: LAPAROSCOPIC GASTRIC BAND REMOVAL WITH LAPAROSCOPIC GASTRIC SLEEVE RESECTION WITH UPPER ENDO;  Surgeon: Deanna Hausen, MD;  Location: [facility];  Service: General;  Laterality: N/A;   LAPAROSCOPIC GASTRIC BANDING     hiatal hernia repair also   TUBAL LIGATION     Patient Active Problem List   Diagnosis Date Noted   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 Patient felt like she was experiencing constant slow wave and it got so bad she had to lay down. It has since subsided so that she only notices it when she moves her head really quickly. She feels some tingling in her head and things start to go out of focus but later clarifies it feels like the room is spinning. The first round of waviness the patient experienced, she felt like everything was moving all night long and was worse as she moved. Does not recall experiencing any amplified or damped hearing around this time. Patient does report that she does feel like she cannot hear as well out of both sides for the last few months but does not seem to correlate with new symptoms.   Pt accompanied by: self  PERTINENT HISTORY: Deanna Edwards is a 62 y.o. female.  Patient is a 62 year old female with a past medical history of hypertension presenting to the emergency department for dizziness.  Patient states that she had a brief episode of dizziness on Tuesday that resolved on its own and she has had intermittent dizziness since yesterday.  She states that she was seen by her primary doctor and had a normal EKG and was recommended to come to the ER for further evaluation.  She states that she has been feeling off balance and had some associated nausea.  She states that she feels a lightheaded type of dizzy like she might pass out.  She denies any chest pain or shortness of breath.  She denies any numbness or  weakness.   The history is provided by the patient and the spouse.  Dizziness  PAIN:  Are you having pain? No  Dizziness 6-7/10  PRECAUTIONS: Fall  WEIGHT BEARING RESTRICTIONS: No  FALLS: Has patient fallen in last 6 months? No  LIVING ENVIRONMENT: Lives with: lives with their spouse Lives in: House/apartment Stairs: no steps  Has following equipment at home:  BP cuff   PLOF: Independent, Independent with basic ADLs, Independent with gait, and Independent with transfers  PATIENT GOALS: get rid of dizziness, be able to focus between 2 computer screens for work    OBJECTIVE:   DIAGNOSTIC FINDINGS: CTA HEAD FINDINGS   IMPRESSION: Head CT:   Normal.  COGNITION: Overall cognitive status: Within functional limits for tasks assessed   SENSATION: Not tested  EDEMA:  None  POSTURE:  Rounded shoulders and forward head  Cervical ROM:    Active A/PROM (deg) eval  Flexion WNL  Extension WNL  Right lateral flexion WNL  Left lateral flexion WNL  Right rotation WNL  Left rotation WNL  (Blank rows = not tested)  STRENGTH:  Denies any acute changes to strength  PATIENT SURVEYS:  FOTO 43, predicted 60  VESTIBULAR ASSESSMENT:  GENERAL OBSERVATION: seems anxious    SYMPTOM BEHAVIOR:  Subjective history: see above   Non-Vestibular symptoms: changes in vision Type of dizziness: Spinning/Vertigo, Unsteady with head/body turns, Lightheadedness/Faint, "World moves", and like being on a boat   Frequency: initially constant, now only with specific head turns/fast movements   Duration: present unless she is completely still  Aggravating factors: Induced by motion: bending down to the ground and turning head quickly and Worse outside or in busy environment Relieving factors: head stationary, slow movements, and avoid busy/distracting environments  Progression of symptoms: better  OCULOMOTOR EXAM:  VBI: negative bilaterally Ocular Alignment: normal  Ocular ROM: No  Limitations  Spontaneous Nystagmus: absent  Gaze-Induced Nystagmus: absent  Smooth Pursuits: saccades  Saccades: extra eye movements and slow - grossly WNL for age  Convergence/Divergence: < 5 cm  VESTIBULAR - OCULAR REFLEX:   Slow VOR: WNL  VOR Cancellation: Normal Head-Impulse Test: Very apprehensive with movement and has difficult time keeping eyes on focus due to apprehension/guarding, possible positive bilaterally, more consistent R than L and reports symptoms with rapid R head movement  Dynamic Visual Acuity: Line 9 - Static, Line 5 - Dynamic, 4 line difference abnormal  VBI: negative   POSITIONAL TESTING: Held due to elevated BP. Subjective not consistent with BPPV but can assess to fully rule out as indicated.     Vitals:   05/07/22 1540 05/07/22 1607  BP: (!) 124/90 (!) 142/95  Pulse: 78 (!) 107    Provided patient with a sticky note and advised she follow up with PCP due to consistent elevated  readings.   VESTIBULAR TREATMENT:                                                                                                    Seated VOR x 1 viewing with plain background 1 x 30" vertical / horizontal reporting increased symptoms with both  PATIENT EDUCATION: Education details: exam findings, anatomy/function of inner ear/vestibular system, POC, holding off on HEP until we assess how she felt after Epley  Person educated: Patient Education method: Explanation Education comprehension: verbalized understanding  HOME EXERCISE PROGRAM: Gaze Stabilization: Sitting    Keeping eyes on target on wall 5 feet away, tilt head down 15-30 and move head side to side for 30 seconds. Repeat while moving head up and down for 30 seconds. Do 5 sessions per day. Repeat using target on pattern background.  GOALS: Goals reviewed with patient? Yes  SHORT TERM GOALS: Target date: 05/23/2022    Will be compliant with appropriate progressive HEP  Baseline: Goal status: INITIAL  2.   Dizziness to be no more than 3/10 at worst  Baseline:  Goal status: INITIAL   LONG TERM GOALS: Target date: 06/13/2022    Dizziness to have completely resolved  Baseline:  Goal status: INITIAL  2.  Will score at least 22/24 on FGA  Baseline:  Goal status: INITIAL - updated to FGA for ease of testing  3.  Will be independent in vestibular management techniques in case she has recurrence of symptoms  Baseline:  Goal status: INITIAL    ASSESSMENT:  CLINICAL IMPRESSION: Session emphasized further vestibular assessment in order to further primary driver of symptom report. 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 Unable to fully accurately determine side given patient's apprehension with HIT but subjectively reports greater symptoms to R. DVA is outside normal limits with 4 line difference. Held further positional testing in today's session due to elevated BP; recommended follow up with PCP to help manage. Patient will benefit from skilled physical therapy services to address impairments.  OBJECTIVE IMPAIRMENTS: Abnormal gait, decreased balance, and decreased coordination.   ACTIVITY LIMITATIONS: squatting, stairs, reach over head, hygiene/grooming, and locomotion level  PARTICIPATION LIMITATIONS: driving, shopping, community activity, and occupation  PERSONAL FACTORS: Age, Behavior pattern, Fitness, Past/current experiences, and Time since onset of injury/illness/exacerbation are also affecting patient's functional outcome.   REHAB POTENTIAL: Good  CLINICAL DECISION MAKING: Stable/uncomplicated  EVALUATION COMPLEXITY: Low   PLAN:  PT FREQUENCY: 2x/week  PT DURATION: 6 weeks  PLANNED INTERVENTIONS: Therapeutic exercises, Therapeutic activity, Neuromuscular re-education, Balance training, Gait training, Patient/Family education, Self Care, Joint mobilization, Vestibular training, Canalith repositioning, and Re-evaluation  PLAN  FOR NEXT SESSION: how did follow up call with PCP go about BP? SOT, positional testing if indicated, FGA assess/write goal, habituation exercises - progress VOR viewing, gaze stabilization   Malachi Carl, PT, DPT

## 2022-05-09 ENCOUNTER — Encounter: Payer: Self-pay | Admitting: Physical Therapy

## 2022-05-09 ENCOUNTER — Ambulatory Visit: Payer: 59 | Admitting: Physical Therapy

## 2022-05-09 VITALS — BP 119/84 | HR 81

## 2022-05-09 DIAGNOSIS — R42 Dizziness and giddiness: Secondary | ICD-10-CM | POA: Diagnosis not present

## 2022-05-09 DIAGNOSIS — R2681 Unsteadiness on feet: Secondary | ICD-10-CM

## 2022-05-09 NOTE — Therapy (Signed)
OUTPATIENT PHYSICAL THERAPY VESTIBULAR TREATMENT   Patient Name: Deanna Edwards MRN: [id number] DOB:[birth year], 62 y.o., female Today's Date: 05/09/2022  END OF SESSION:  PT End of Session - 05/09/22 1536     Visit Number 3    Number of Visits 13    Date for PT Re-Evaluation 06/13/22    Authorization Type [employer]    Authorization Time Period 05/02/22 to 06/13/22    PT Start Time 1533    PT Stop Time 1615    PT Time Calculation (min) 42 min    Equipment Utilized During Treatment Gait belt    Activity Tolerance Patient tolerated treatment well    Behavior During Therapy WFL for tasks assessed/performed             Past Medical History:  Diagnosis Date   Arthritis    Dyspnea    states unchanged   GERD (gastroesophageal reflux disease)    no issue since lap band surgery   Hx of laparoscopic gastric banding 08-21-12    10'10   Hypertension    Pneumonia    PONV (postoperative nausea and vomiting)    Sleep apnea    no CPAP   Past Surgical History:  Procedure Laterality Date   CERVICAL CONIZATION W/BX     GASTRIC BANDING PORT REVISION N/A 08/25/2012   Procedure: GASTRIC BANDING PORT REVISION;  Surgeon: Pedro Earls, MD;  Location: [facility];  Service: General;  Laterality: N/A;  Lap Band Port Replacement   HERNIA REPAIR     LAPAROSCOPIC GASTRIC BAND REMOVAL WITH LAPAROSCOPIC GASTRIC SLEEVE RESECTION N/A 12/11/2015   Procedure: LAPAROSCOPIC GASTRIC BAND REMOVAL WITH LAPAROSCOPIC GASTRIC SLEEVE RESECTION WITH UPPER ENDO;  Surgeon: Johnathan Hausen, MD;  Location: [facility];  Service: General;  Laterality: N/A;   LAPAROSCOPIC GASTRIC BANDING     hiatal hernia repair also   TUBAL LIGATION     Patient Active Problem List   Diagnosis Date Noted   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 Still feeling a little lightheaded, but it has gotten better since it was this morning. Reports exercises are going well at home, yesterday went really well. Worse when going side to side vs. Up and down.   Pt accompanied by: self  PERTINENT HISTORY: Deanna Edwards is a 62 y.o. female.   Patient is a 62 year old female with a past medical history of hypertension presenting to the emergency department for dizziness.  Patient states that she had a brief episode of dizziness on Tuesday that resolved on its own and she has had intermittent dizziness since yesterday.  She states that she was seen by her primary doctor and had a normal EKG and was recommended to come to the ER for further evaluation.  She states that she has been feeling off balance and had some associated nausea.  She states that she feels a lightheaded type of dizzy like  she might pass out.  She denies any chest pain or shortness of breath.  She denies any numbness or weakness.   The history is provided by the patient and the spouse.   PAIN:  Are you having pain? No  PRECAUTIONS: Fall  WEIGHT BEARING RESTRICTIONS: No  FALLS: Has patient fallen in last 6 months? No  LIVING ENVIRONMENT: Lives with: lives with their spouse Lives in: House/apartment Stairs: no steps  Has following equipment at home:  BP cuff   PLOF: Independent, Independent with basic ADLs, Independent with gait, and Independent with transfers  PATIENT GOALS: get rid of dizziness, be able to focus between 2 computer screens for  work    OBJECTIVE:   DIAGNOSTIC FINDINGS: CTA HEAD FINDINGS   IMPRESSION: Head CT:   Normal.  COGNITION: Overall cognitive status: Within functional limits for tasks assessed   SENSATION: Not tested  EDEMA:  None  POSTURE:  Rounded shoulders and forward head  Cervical ROM:    Active A/PROM (deg) eval  Flexion WNL  Extension WNL  Right lateral flexion WNL  Left lateral flexion WNL  Right rotation WNL  Left rotation WNL  (Blank rows = not tested)  STRENGTH:  Denies any acute changes to strength  PATIENT SURVEYS:  FOTO 43, predicted 60  VESTIBULAR ASSESSMENT:    Vitals:   05/09/22 1540  BP: 119/84  Pulse: 81   POSITIONAL TESTING: Right Dix-Hallpike: no nystagmus Left Dix-Hallpike: no nystagmus Right Roll Test: no nystagmus Left Roll Test: no nystagmus Right Sidelying: no nystagmus Left Sidelying: no nystagmus and pt feeling some lightheadedness   When coming up from sidelying and DixHallpike positions, pt reporting feeling wavy, but subsides quickly    [facility] PT Assessment - 05/09/22 1547       Functional Gait  Assessment   Gait assessed  Yes    Gait Level Surface Walks 20 ft in less than 7 sec but greater than 5.5 sec, uses assistive device, slower speed, mild gait deviations, or deviates 6-10 in outside of the 12 in walkway width.   7 seconds   Change in Gait Speed Able to smoothly change walking speed without loss of balance or gait deviation. Deviate no more than 6 in outside of the 12 in walkway width.    Gait with Horizontal Head Turns Performs head turns smoothly with slight change in gait velocity (eg, minor disruption to smooth gait path), deviates 6-10 in outside 12 in walkway width, or uses an assistive device.    Gait with Vertical Head Turns Performs task with slight change in gait velocity (eg, minor disruption to smooth gait path), deviates 6 - 10 in outside 12 in walkway width or uses assistive device    Gait and Pivot Turn Pivot turns  safely within 3 sec and stops quickly with no loss of balance.    Step Over Obstacle Is able to step over 2 stacked shoe boxes taped together (9 in total height) without changing gait speed. No evidence of imbalance.    Gait with Narrow Base of Support Ambulates less than 4 steps heel to toe or cannot perform without assistance.    Gait with Eyes Closed Walks 20 ft, slow speed, abnormal gait pattern, evidence for imbalance, deviates 10-15 in outside 12 in walkway width. Requires more than 9 sec to ambulate 20 ft.   9.69 seconds   Ambulating Backwards Walks 20 ft, no assistive devices, good speed, no evidence for imbalance, normal gait    Steps Alternating feet,  no rail.    Total Score 22    FGA comment: 22/30              M-CTSIB  Condition 1: Firm Surface, EO 30 Sec, Normal Sway  Condition 2: Firm Surface, EC 30 Sec, Mild Sway  Condition 3: Foam Surface, EO 30 Sec, Normal Sway  Condition 4: Foam Surface, EC 22 Sec, Moderate Sway       VESTIBULAR TREATMENT:                                                                                                    Access Code: [email or web address] URL: [email or web address] Date: 05/09/2022 Prepared by: Janann August  Added below to HEP for improved vestibular input/balance:   Exercises - Standing Balance with Eyes Closed on Foam  - 2 x daily - 7 x weekly - 3 sets - 30 hold - Romberg Stance on Foam Pad with Head Rotation  - 2 x daily - 7 x weekly - 2 sets - 10 reps - Walking with Head Rotation  - 2 x daily - 7 x weekly - 3 sets - next to hallway with fingertips for balance   Standing horizontal/vertical saccades and smooth pursuits 10 reps of each, pt with no symptoms and no dizziness.   PATIENT EDUCATION: Education details: Exam findings, pt asking about what is causing her dizziness- PT giving explanation regarding peripheral vestibular dysfunction, additions to HEP  Person educated: Patient Education method:  Explanation Education comprehension: verbalized understanding  HOME EXERCISE PROGRAM: Gaze Stabilization: Sitting    Keeping eyes on target on wall 5 feet away, tilt head down 15-30 and move head side to side for 30 seconds. Repeat while moving head up and down for 30 seconds. Do 5 sessions per day. Repeat using target on pattern background.  Access Code: [facility] URL: [email or web address] Date: 05/09/2022 Prepared by: Janann August  Exercises - Standing Balance with Eyes Closed on Foam  - 2 x daily - 7 x weekly - 3 sets - 30 hold - Romberg Stance on Foam Pad with Head Rotation  - 2 x daily - 7 x weekly - 2 sets - 10 reps - Walking with Head Rotation  - 2 x daily - 7 x weekly - 3 sets  GOALS: Goals reviewed with patient? Yes  SHORT TERM GOALS: Target date: 05/23/2022    Will be compliant with appropriate progressive HEP  Baseline: Goal status: INITIAL  2.  Dizziness to be no more than 3/10 at worst  Baseline:  Goal status: INITIAL   LONG TERM GOALS: Target date: 06/13/2022    Dizziness to have completely resolved  Baseline:  Goal status: INITIAL  2.  Will score at least 27/30 on FGA  Baseline: 21/30 Goal status: INITIAL - updated to FGA for ease of testing  3.  Will be independent in vestibular management techniques in case she has recurrence of symptoms  Baseline:  Goal status: INITIAL  4.  Pt will perform DVA in 2 line difference or less in order to demo improved VOR.  Baseline: 4 line difference  Goal  status: INITIAL  5.  Pt will improve condition 4 of mCTSIB for 30 seconds in order to demo improved vestibular input for balance.  Baseline: 22 seconds Goal status: INITIAL   ASSESSMENT:  CLINICAL IMPRESSION: Pt's BP WNL for therapy today. Continued to educate pt to monitor at home and make PCP aware if it stays elevated. Focused on further vestibular assessment with pt testing negative for all positional testing today. Pt scored a 21/30  on the FGA, indicating a moderate fall risk with pt most challenged by head turns, EC, and tandem gait. Pt only able to hold condition 4 of mCTSIB for 22 seconds, indicating decr vestibular input for balance. Remainder of session focused on addition of vestibular exercises to HEP. Pt tolerated session well, will continue to progress towards LTGs.    OBJECTIVE IMPAIRMENTS: Abnormal gait, decreased balance, and decreased coordination.   ACTIVITY LIMITATIONS: squatting, stairs, reach over head, hygiene/grooming, and locomotion level  PARTICIPATION LIMITATIONS: driving, shopping, community activity, and occupation  PERSONAL FACTORS: Age, Behavior pattern, Fitness, Past/current experiences, and Time since onset of injury/illness/exacerbation are also affecting patient's functional outcome.   REHAB POTENTIAL: Good  CLINICAL DECISION MAKING: Stable/uncomplicated  EVALUATION COMPLEXITY: Low   PLAN:  PT FREQUENCY: 2x/week  PT DURATION: 6 weeks  PLANNED INTERVENTIONS: Therapeutic exercises, Therapeutic activity, Neuromuscular re-education, Balance training, Gait training, Patient/Family education, Self Care, Joint mobilization, Vestibular training, Canalith repositioning, and Re-evaluation  PLAN FOR NEXT SESSION:  habituation exercises - progress VOR viewing, gaze stabilization, balance with head turns, EC   Janann August, PT, DPT 05/09/22 4:21 PM

## 2022-05-14 ENCOUNTER — Ambulatory Visit: Payer: 59 | Admitting: Physical Therapy

## 2022-05-16 ENCOUNTER — Ambulatory Visit: Payer: 59 | Admitting: Physical Therapy

## 2022-05-21 ENCOUNTER — Telehealth: Payer: Self-pay | Admitting: Physical Therapy

## 2022-05-21 ENCOUNTER — Ambulatory Visit: Payer: 59 | Admitting: Physical Therapy

## 2022-05-21 NOTE — Telephone Encounter (Signed)
Called patient and LVM, regarding no show to session. Reminded of next upcoming appointment 3-cancellation/no-show policy.   Maryruth Eve, PT, DPT

## 2022-05-23 ENCOUNTER — Encounter: Payer: Self-pay | Admitting: Physical Therapy

## 2022-05-23 ENCOUNTER — Telehealth: Payer: Self-pay | Admitting: Physical Therapy

## 2022-05-23 ENCOUNTER — Ambulatory Visit: Payer: 59 | Admitting: Physical Therapy

## 2022-05-23 NOTE — Telephone Encounter (Signed)
Called patient and LVM that given 3 cancellations and 1 no show would require D/C at this time and new referral back to PT given company policy.   Maryruth Eve, PT, DPT

## 2022-05-23 NOTE — Therapy (Signed)
[facility] [address] [place], [place], [address] Phone: [phone]   Fax:  [phone]  Patient Details  Name: Deanna Edwards MRN: [id number] Date of Birth: [birth year] Referring Provider:  Alyson Ingles, PA-C  Encounter Date: 05/23/2022  PHYSICAL THERAPY DISCHARGE SUMMARY  Visits from Start of Care: 4  Current functional level related to goals / functional outcomes: Unknown patient not returned since last visit   Remaining deficits: Unknown patient not returned since last visit   Education / Equipment: LVM reporting that patient would require new referral back to PT if interested in starting again   Patient agrees to discharge. Patient goals were not met. Patient is being discharged due to not returning since the last visit.   Carmelia Bake, PT, DPT 05/23/2022, 10:50 AM  [facility] [address] [place], [place], [address] Phone: [phone]   Fax:  [phone]

## 2022-05-28 ENCOUNTER — Ambulatory Visit: Payer: 59 | Admitting: Physical Therapy

## 2022-05-30 ENCOUNTER — Encounter: Payer: 59 | Admitting: Physical Therapy

## 2022-06-04 ENCOUNTER — Encounter: Payer: 59 | Admitting: Physical Therapy

## 2022-06-06 ENCOUNTER — Encounter: Payer: 59 | Admitting: Physical Therapy

## 2022-06-11 ENCOUNTER — Encounter: Payer: 59 | Admitting: Physical Therapy

## 2022-06-13 ENCOUNTER — Encounter: Payer: 59 | Admitting: Physical Therapy

## 2022-10-17 ENCOUNTER — Encounter: Payer: Self-pay | Admitting: Psychology

## 2023-04-07 ENCOUNTER — Encounter: Payer: 59 | Attending: Psychology | Admitting: Psychology

## 2023-04-07 DIAGNOSIS — R4189 Other symptoms and signs involving cognitive functions and awareness: Secondary | ICD-10-CM | POA: Insufficient documentation

## 2023-04-07 NOTE — Progress Notes (Addendum)
 NEUROPSYCHOLOGICAL EVALUATION [place]  Physical Medicine and Rehabilitation   Patient: Deanna Edwards  MRN: [id number] DOB: 1960/03/16  Age: 63 y.o. Sex: Female Race/Ethnicity: White or Caucasian  Years of Formal Education: 12 Handedness: Right   Referring Provider: Wilfrid Lund, PA   Provider/Clinical Neuropsychologist: Thelma Comp, PsyD   Date of Service: 04/16/2023 Start Time: 10:00 AM End Time: 11:00 AM   Location of Service:  [facility] Physical Medicine & Rehabilitation Department [place]. [facility] [address] [place]. [address] [place], [place] [address] Phone: [phone]  Billing Code/Service:            96116/96121  PATIENT CONSENT AND CONFIDENTIALITY  The patient's understanding of the reason for referral was intact. The neuropsychological evaluation process was discussed with the patient and she consented to proceed with the evaluation.  Consent for Evaluation and Treatment: Signed: Yes Explanation of Privacy Policies: Signed: Yes Discussion of Confidentiality Limits: Yes REASON FOR REFERRAL  The patient was referred for neuropsychological evaluation by primary care provider, Horton Marshall, PA, to facilitate differential diagnosis and treatment planning. Per records, the patient was seen on 10/11/2022 by the referring provider due to ongoing difficulty with attention and concentration deficit that are consistent with ADHD, stress, and brain fog. Referring provider notes indicate attention/concentration difficulties are not showing typical / expected improvement from stimulant medication. Records also note a history of depression and anxiety (medication managed), and current external stressors involving work and the health of a loved one. Records also report a family history of Alzheimer's disease and concerns for memory loss in the patient. The referring provider referred the patient for neuropsychological evaluation to assist with  differential diagnosis and treatment planning, and recommended engaging in individual therapy/counseling.      During the clinical interview for the current evaluation, the patient is hoping the evaluation will ultimately help identify how to improve her symptoms. She also expressed concern that her symptoms were early signs of Alzheimer's disease and had interest in the evaluation for that reason as well.    HISTORY OF PRSENTING ILLNESS  Premorbid Cognitive Functioning:  Childhood & Adolescence: Upon interview, the patient indicated that she had significant struggles in school during childhood/adolescence. These issues occurred prior to age 16. She endorsed difficulty with attention to detail, sustaining attention, attending when addressed directly, being easily distractible, and forgetfulness. Symptoms primarily impacted school. Symptoms were present outside of school, but functional impact was mild.   Adulthood: The patient reported that the above symptoms from childhood persisted throughout adulthood. Symptoms, however, did not appear to cause notable functional impairments.   Cognitive Symptom Onset & Course: Around 2017, the patient indicated she experienced a worsening of premorbid cognitive symptoms that was gradual in onset and stable in course. The patient reported that symptoms impact her functioning at work and at home. Onset of cognitive symptoms coincided with marked increases in stress linked to new situational factors at that time (i.e., caretaking for parent with Alzheimer's disease at home, while working from home). Situational / environmental stressors and significant life changes have continued since that time. Within the above context, the patient endorsed difficulties with anxiety and mood related symptoms which largely persist. The patient reported a brief (2 or maybe 3 weeks) improvement in cognitive symptoms upon switching from Vyvance to Adderall in 2023. Subsequent dose increases  have reportedly not improved symptom control.        Current Cognitive Complaints:  Memory: The patient's endorsements of  short-term memory problems were variable. She denied significant difficulties remembering recent events and basic details of conversations. She effectively manages appointments (with calendar) and has no difficulties remembering medications. She has no difficulties getting lost while driving.   The patient endorsed increased difficulty with misplacing things and is less organized, the latter of which is a marked change for her. She endorsed difficulty remembering things from workplace trainings without writing them down.    Processing Speed: The patient denied subjective changes in processing speed. She indicated her speed of task completion is slowed due to interference from attentional deficits and "going down rabbit holes."     Attention & Concentration: The patient denied having difficulty with maintaining focus with face-to-face one-on-one conversation or watching the news.   The patient endorsed struggles with distractibility at home indicating "I can't stay focused long enough to keep one room clean, I keep jumping tasks, one to the other." When working with a customer for work, she indicated I'm "all just all over the place."  Concentration difficulties were described as increasing her stress and worries about job performance.    Language: The patient reported no difficulties with basic expressive and receptive language. She denied any changes or difficulties with reading, writing, or doing basic math.    Visual-Spatial: The patient denied any significant changes or difficulties with respect to visual-spatial functioning.    Executive Functioning: The patient described being able to solve problems, but with reduced efficiency. She described "going down a rabbit hole" pursuing one tech-support solution that may not be effective, before ultimately changing to the route that  will solve the problem. She reported being less organized, noting that she has historically been an organized person. She denied any changes or problems with irritability, impulsivity, or personality change.      Motor/Sensory Complaints: The patient denied any changes in her sense of taste or sense of smell. She reported gradual declines in vision since adulthood. She wears hearing aids due to moderate hearing loss. She denied any significant improvement in her cognitive difficulties since obtaining hearing aids. With respect to motor functioning, the patient denied any problems with balance, frequent falls, or changes in gait. She endorsed brief spontaneous feelings of lightheadedness which occur once or twice a week. She reported experiencing intermittent, bilateral, hand tremors that occur without clear cause (worse on right) that she suspects began four years ago. She denied any changes in handwriting or micrographia.  Emotional and Behavioral Functioning: The patient reported long-running tendency towards generalized anxiety and worry, but no major mental health concerns until around 2016. Symptoms of anxiety and depression progressed gradually over time within the context of the exceptional challenges of providing care giving while maintaining full time employment. The patient experienced the loss of the loved one in 2018. Additional stressors since that time included significant changes in work responsibilities. She reported significant worry about work as her spouse is unable to work due to symptoms of a recently diagnosed progressive neurological condition. She indicated feeling overwhelmed and that her stress has never returned to baseline since 2016.    She began prescription medication for anxiety and depression symptoms several years ago (bupropion, duloxetine) and doses have remained unchanged. She reported initial improvement in anxiety. She has been on stimulant medication for attention  difficulties for approximately three years. She was on Vyvance for approximately two years (no significant symptom improvement) before switching to Adderall. As noted, cognitive symptoms improved temporarily upon switching stimulant medications.     She  engaged in individual therapy sometime after 2020, completing 10 sessions. She described some ambivalence when asked if she found it helpful. The patient indicated that she continues to experience generalized worry and feels her stress levels have never returned to baseline. She reported experiencing one panic attack several years ago. With respect to mood, she described reduced energy and indications of anhedonia, but no frequent sadness. She acknowledged that she would likely benefit from taking more time to care for herself but struggles to find time to do so. The patient denied any history of suicidal ideation, mania, hallucination, symptoms of PTSD, or other severe psychiatric symptoms.    Sleep: She has experienced a disruption to her sleep schedule as she has been moved to the 2pm-11pm shift at the start of January this year but has historically been a "morning person."  She reported difficulty falling asleep, needing to "unwind from work," but no difficulty with maintenance. She currently sleeps around five to six hours. She typically needs six to seven hours to feel rested. She denied any indications of RBD.  Appetite: She reported increased appetite, citing it as a coping tendency.  Caffeine: 2-3 cups of coffee per day. Alcohol Use: Denied any current use. Tobacco Use: Denied any current use. Recreational Substance Use: Denied any current use.    Level of Functional Independence: The patient is intact with all basic and instrumental activities of daily living. She denied any problems with managing medications, finances, meal preparation, or doing household tasks (excluding distractibility). She drives independently and without difficulty. She  reported that she began using her phone calendar to track appointments and future obligations.     Medical History/Record Review: Her medical history includes hypertension (medication managed), arthritis, shoulder surgery (2017), headaches three to four times per week (responsive to OTC medication), gastric lap band surgery (removed 2014). She denied any history of cancer, stroke, TBI, heart attack, or seizure. She denied experience chronic pain overall. Per records and patient report, the patient was previously diagnosed with sleep apnea, but after losing weight she reported being told she no longer needed to use a CPAP.    Family Neurologic/Medical Hx: The patient has a family history of suspected Alzheimer's disease in her mother and maternal grandmother (onset mid to late 67s for both), ADHD diagnosis in biological sister, ASD diagnosis in offspring. The patient denied any history of Parkinson's disease or essential tremor.    Imaging/Lab Work: Per records, the patient underwent a head CT in March of 2024. Findings were reportedly unremarkable. 2024 lab work ordered by the referring provider were reportedly normal.    Medications: Per patient report: Bupropion 150 mg BID Duloxetine 60 mg daily Adderall (generic) 30 mg ER BID Amlodipine besylate 5 mg daily valsartan-hydrochlorothiazide 80mg    Educational/Vocational History: The patient reported struggling in grade school, having difficulty across all subjects. She reported difficulty with reading comprehension and information retention, and writing. 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 She did report struggles with attention and concentration. She denied any behavioral issues. She indicated that she likely earned Cs-Bs overall, but with concerted effort. Her favorite subject was math.   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 She indicated  she was better able to focus when working towards her cosmetology certificate, and later with computer-repair/tech support, as the subject matter interested her. 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 She has working in this format for around a year and continues to struggle with the change. She describes worry about her performance metrics and has received feedback from her boss commenting on it. She indicated that she is always able to resolve the issue but takes a less efficient route than has historically been the case for her.    Psychosocial: Marital Status: The patient has been married to her current husband for 40 years. She had one prior marriage.   Children/Grandchildren- The patient has two adult children. Living Situation- The patient lives with her spouse and son.  Daily Activities/Hobbies- The patient described enjoying watching old television shows and playing her ukulele. She meets with friends on a weekly basis. She acknowledges that she should make more time for herself, but struggles given work and home responsibilities.   Behavioral Observations: The patient was seen on an outpatient basis in the [facility] PM&R office for the clinical interview. She arrived on time, was unaccompanied. Orientation: Intact to person, place, time, and situation.  Ability to Participate in Interview- The patient readily answered all questions seemingly to the best of her ability and with adequate detail.  Attitude/Interactions: She was cooperative and socially appropriate throughout the session.  Attention: No appreciable signs of inattention.  Conversational Language: Spontaneous speech was prosodic, fluent, and  well-articulated. She did not demonstrate word-finding problems or paraphasic errors in conversational speech. Receptive language skills appeared intact.  Thought Process/Content: No indications of disordered thought were appreciated. Responses to questions were well formulated and pertinent to the question asked.  Motor Problems: The patient ambulated independently and at a typical pace. Not appreciable tremor was noted during the interview.  Affect/Mood: The patient's affect and mood were largely neutral. She became slightly tearful at times when discussing caring for her mother. Behavior: WNL Insight: WNL   SUMMARY / CLINICAL IMPRESSIONS  The patient is a 63 year old woman presenting with cognitive symptoms primarily involving deficits in attention and concentration. Broadly, she is intact with respect to basic and instrumental activities of daily living. She works in [employer] and reports being effective at her job, but with reduced efficiency. She described a history of attention / concentration difficulties as a youth in school, but functioned well as an adult and reported no significant difficulties due to cognitive issues until around 2016. Cognitive symptom onset was gradual and symptoms have been stable/unremitting. There are some indications of exacerbation of symptoms due to stress. Psychiatrically, the patient had no notable history of mental health challenges until 2016 excluding signs of premorbid tendencies towards generalized worry. Gradual and progressive declines in mood and elevations in anxiety coincided with onset of cognitive symptoms in 2016, all of which occurred in the context of life situations most would describe as emotionally and cognitively challenging. While some situational stressors have changed, additional stressors emerged within the last one to two years. The patient reported initial benefit in cognitive symptoms when starting stimulant medication, but improvement was  not maintained.  She reported improvement in symptoms of anxiety when started on anxiolytic/anti-depressant medication around 2016, and those medications have remained unchanged. Symptoms did not improve upon reductions in anxiety. She presents with a relatively unremarkable medical history. She has a family history of Alzheimer's diease (onset in late life). Behaviorally, she reported recent disruptions to sleep that coincide with recent changes  to her work schedule. She herself indicated her hopes that the evaluation can provide insight into her cognitive symptoms and help guide treatment and reduction of her symptoms. She similarly indicated she hopes the evaluation can address concerns regarding the possibility of Alzheimer's disease.   DISPOSITION / PLAN  The patient has been set up for a formal neuropsychological assessment to objectively assess her cognitive functioning across domains to establish the patient's cognitive profile. This data, in conjunction with information obtained via clinical interview and medical record review, will help clarify likely etiology and guide treatment recommendations. Once data collection and interpretation have been completed, the findings / diagnosis and recommendations will be reviewed and discussed with the patient during a feedback appointment with the neuropsychologist. Based on the collaborative dialogue with the patient during the feedback, recommendations may be adjusted / tailored as needed. A formal report will be produced and provided to the patient and the referring provider.   Visit Diagnosis:  Cognitive Changes Unspecified Anxiety Disorder    This report was generated using voice recognition software. While this document has been carefully reviewed, transcription errors may be present. I apologize in advance for any inconvenience. Please contact me if further clarification is needed.             Thelma Comp, PsyD             Neuropsychologist

## 2023-04-08 ENCOUNTER — Encounter: Payer: Self-pay | Admitting: Psychology

## 2023-04-15 ENCOUNTER — Encounter: Payer: 59 | Attending: Psychology

## 2023-04-15 DIAGNOSIS — F419 Anxiety disorder, unspecified: Secondary | ICD-10-CM | POA: Diagnosis present

## 2023-04-15 DIAGNOSIS — R4189 Other symptoms and signs involving cognitive functions and awareness: Secondary | ICD-10-CM | POA: Insufficient documentation

## 2023-04-15 DIAGNOSIS — R41840 Attention and concentration deficit: Secondary | ICD-10-CM | POA: Insufficient documentation

## 2023-04-15 NOTE — Progress Notes (Unsigned)
 Behavioral Observation:  The patient appeared well-groomed and appropriately dressed. Her manners were polite and appropriate to the situation. The patient's attitude towards testing was positive and her effort was good. Patient was self-critical at times during the testing session. The patient stated that she had fun during testing and understood instructions well.  A mild hand tremor was observed in her dominant hand during testing. She walked at a typical pace and no issues with balance were noted.  Neuropsychology Note  Deanna Edwards completed 180 minutes of neuropsychological testing with technician, Staci Acosta, BA, under the supervision of Doy Mince, PsyD., Clinical Neuropsychologist. The patient did not appear overtly distressed by the testing session, per behavioral observation or via self-report to the technician. Rest breaks were offered.   Clinical Decision Making: In considering the patient's current level of functioning, level of presumed impairment, nature of symptoms, emotional and behavioral responses during clinical interview, level of literacy, and observed level of motivation/effort, a battery of tests was selected by Dr. Matt Holmes during initial consultation on 04/07/2023 . This was communicated to the technician. Communication between the neuropsychologist and technician was ongoing throughout the testing session and changes were made as deemed necessary based on patient performance on testing, technician observations and additional pertinent factors such as those listed above.  Tests Administered: Automatic Data Edition (BNT-2) Brief Visuospatial Memory Test-Revised (BVMT-R) Benton Judgment of Line Orientation (JOLO) [place] Verbal Learning Test-Third Edition (CVLT-3) Conners Continuous Performance Test 3rd Edition (CPT-3) Controlled Oral Word Association Test (FAS & Animals) Delis-Kaplan Executive Function System (D-KEFS), select subtests Grooved  Pegboard Test Rey Complex Figure Test (RCFT), select subtests Trail Making Test (TMT; Part A & B) Wechsler Adult Intelligence Scale-Fourth Edition (WAIS-IV), select subtests Wechsler Abbreviated Scale of Intelligence Civil Service fast streamer) Wechsler Memory Scale-Fourth Edition (WMS-IV)  Wechsler Memory Scale-Third Edition (WMS-III)  The Beck Depression Inventory-II (BDI-II) Beck Anxiety Inventory (BAI)  Results:       Percentile Range  INTELLECTUAL ABILITY:         WASI-II FSIQ-2  SS = 97 42 %ile Average  WASI-II Vocabulary   t = 50 50 %ile Average  WASI-II Matrix Reasoning   t = 47 37 %ile Average  ATTENTION AND WORKING MEMORY         WAIS-IV          Digit Span  ss = 10 50 %ile Average   DSF  ss = 11 63 %ile Average   Span:          DSB  ss = 10 50 %ile Average   Span:          DSS  ss = 10 50 %ile Average   Span:         WMS-III          Spatial Span  ss = 10 50 %ile Average   SSF  ss = 9 37 %ile Average   Span:          SSB  ss = 10 50 %ile Average   Span:         PROCESSING SPEED         WAIS-IV     Coding  ss = 9 37 %ile Average  WAIS-IV     Cancellation  ss = 8 25 %ile Average  PSYCHOMOTION         Grooved Pegboard - Dominant  t = 35 7 %ile Below Average   # of drops  Grooved Pegboard - Nondominant  t = 40 16 %ile Low Average   # of drops         LANGUAGE         [place] Naming Test  t = 48 42 %ile Average  COWAT - FAS  t = 46 32 %ile Average  COWAT - Animals  t = 34 5 %ile Below Average  DKEFS - Verbal Fluency          Category Switching  ss = 11 63 %ile Average   Switching Accuracy  ss = 11 63 %ile Average  EXECUTIVE FUNCTIONING         DKEFS - Color-Word Interference          Color Naming  ss = 11 63 %ile Average   Word Reading  ss = 8 25 %ile Average   Inhibition  ss = 8 25 %ile Average   Errors  ss = 11 63 %ile Average   Inhibition Switching  ss = 11 63 %ile Average   Errors  ss = 12 75 %ile High Average   Inhibit - Color  ss = 7 16 %ile Low Average    Inhibition/Switching - Inhibit  ss = 13 84 %ile High Average  Trails A  t = 42 21 %ile Low Average   Errors         Trails B  t = 49 45 %ile Average   Errors         MEMORY         BVMT-R          Trial 1  t = 49.0 45 %ile Average   Trial 2  t = 46 32 %ile Average   Trial 3  t = 55.0 68 %ile Average   Total Recall  t = 50.0 50 %ile Average   Learning  t = 57.0 75 %ile High Average   Delayed Recall  t = 58.0 79 %ile High Average   % Retained      >16 %ile WNL   Hits     6-10 %ile Low Average   False Alarms     >16 %ile WNL   Recognition Discriminability     6-10 %ile Low Average  CVLT-III          Trial 1  ss = 10.0 50 %ile Average   Trial 2  ss = 13.0 84 %ile High Average   Trial 3  ss = 14.0 91 %ile Above Average   Trial 4  ss = 13.0 84 %ile High Average   Trial 5  ss = 13.0 84 %ile High Average   Trials 1-5 Total  SS = 115 84 %ile High Average   Trial B  ss = 11.0 63 %ile Average   Short Delay Free Recall  ss = 15.0 95 %ile Above Average   Short Delay Cued Recall  ss = 16.0 98 %ile Exceptionally High   Long Delay Free Recall  ss = 15.0 95 %ile Above Average   Long Delay Cued Recall  ss = 15.0 95 %ile Above Average   List B vs. Trial 1  ss = 11.0 63 %ile Average   LD vs. SD  ss = 13.0 84 %ile High Average   SD vs. Trial 5  ss = 15.0 95 %ile Above Average   Total Repetitions  ss = 12.0 75 %ile High Average   Total Intrusions  ss = 11.0 63 %ile Average  Total Hits  ss = 14.0 91 %ile Above Average   Total False Positives  ss = 14.0 91 %ile Above Average   Recognition Discriminability  ss = 15.0 95 %ile Above Average  Wechsler Memory Scale, 4th Edition (WMS-4)         Log. Mem. Immediate Recall  ss = 12 75 %ile High Average   Logical Memory Delayed Recall  ss = 14 91 %ile Above Average   Logical Recognition    >75th %ile  %ile   VISUAL-SPATIAL         JOLO     56 %ile Average  Rey Complex Figure Copy       >16 %ile WNL  PERSONALITY AND BEHAVIORAL FUNCTIONING        BDI Raw  18        BDI Severity Mild.        BAI Raw 22        BAI Severity Moderate.           Feedback to Patient: Deanna Edwards will return on 04/17/2023 for an interactive feedback session with Dr. Matt Holmes at which time her test performances, clinical impressions and treatment recommendations will be reviewed in detail. The patient understands she can contact our office should she require our assistance before this time.  180 minutes spent face-to-face with patient administering standardized tests, 30 minutes spent scoring Radiographer, therapeutic). [CPT [id number], [id number]]  Full report to follow.

## 2023-04-16 ENCOUNTER — Encounter: Payer: 59 | Admitting: Psychology

## 2023-04-16 DIAGNOSIS — R4189 Other symptoms and signs involving cognitive functions and awareness: Secondary | ICD-10-CM | POA: Diagnosis not present

## 2023-04-16 DIAGNOSIS — F419 Anxiety disorder, unspecified: Secondary | ICD-10-CM

## 2023-04-16 DIAGNOSIS — R41840 Attention and concentration deficit: Secondary | ICD-10-CM

## 2023-04-16 NOTE — Progress Notes (Signed)
 NEUROPSYCHOLOGICAL EVALUATION [facility]  Physical Medicine and Rehabilitation     Patient: Deanna Edwards  MRN: [id number] DOB: 01-03-1961  Age: 63 y.o. Sex: Female Race/Ethnicity: White or Caucasian  Years of Formal Education: 12 Handedness: Right   Referring Provider: Wilfrid Lund, PA   Provider/Clinical Neuropsychologist: Thelma Comp, PsyD   Date of Service: 04/16/2023 Start Time: 10:00 AM End Time: 11:00 AM   Location of Service:  [facility] Physical Medicine & Rehabilitation Department [place] [address], [place]. [address] [place], [place] [address] Phone: [phone]   Billing Code/Service:            21308  Provider/Observer: Thelma Comp, PsyD   REASON FOR REFERRAL  The patient was referred for neuropsychological evaluation by primary care provider, Horton Marshall, PA, to facilitate differential diagnosis and treatment planning. Per records, the patient was seen on 10/11/2022 by the referring provider due to ongoing difficulty with attention and concentration deficit that are consistent with ADHD, stress, and brain fog. Referring provider notes indicate attention/concentration difficulties are not showing typical / expected improvement from stimulant medication. Records also note a history of depression and anxiety (medication managed), and current external stressors involving work and the health of a loved one. Records also report a family history of Alzheimer's disease and concerns for memory loss in the patient. The referring provider referred the patient for neuropsychological evaluation to assist with differential diagnosis and treatment planning, and recommended engaging in individual therapy/counseling.      During the clinical interview for the current evaluation, the patient is hoping the evaluation will ultimately help identify how to improve her symptoms. She also expressed concern that her symptoms were early  signs of Alzheimer's disease and had interest in the evaluation for that reason as well.    HISTORY OF PRSENTING ILLNESS  Premorbid Cognitive Functioning:  Childhood & Adolescence: Upon interview, the patient indicated that she had significant struggles in school during childhood/adolescence. These issues occurred prior to age 62. She endorsed difficulty with attention to detail, sustaining attention, attending when addressed directly, being easily distractible, and forgetfulness. Symptoms primarily impacted school. Symptoms were present outside of school, but functional impact was mild.   Adulthood: The patient reported that the above symptoms from childhood persisted throughout adulthood. Symptoms, however, did not appear to cause notable functional impairments.   Cognitive Symptom Onset & Course: Around 2017, the patient indicated she experienced a worsening of premorbid cognitive symptoms that was gradual in onset and stable in course. The patient reported that symptoms impact her functioning at work and at home. Onset of cognitive symptoms coincided with marked increases in stress linked to new situational factors at that time (i.e., caretaking for parent with Alzheimer's disease at home, while working from home). Situational / environmental stressors and significant life changes have continued since that time. Within the above context, the patient endorsed difficulties with anxiety and mood related symptoms which largely persist. The patient reported a brief (2 or maybe 3 weeks) improvement in cognitive symptoms upon switching from Vyvance to Adderall in 2023. Subsequent dose increases have reportedly not improved symptom control.        Current Cognitive Complaints:  Memory: The patient's endorsements of short-term memory problems were variable. She denied significant difficulties remembering recent events and basic details of conversations. She effectively manages appointments (with calendar) and  has no difficulties remembering medications. She has no difficulties getting lost while driving.  The patient endorsed increased difficulty with misplacing things and is less organized, the latter of which is a marked change for her. She endorsed difficulty remembering things from workplace trainings without writing them down.    Processing Speed: The patient denied subjective changes in processing speed. She indicated her speed of task completion is slowed due to interference from attentional deficits and "going down rabbit holes."     Attention & Concentration: The patient denied having difficulty with maintaining focus with face-to-face one-on-one conversation or watching the news.   The patient endorsed struggles with distractibility at home indicating "I can't stay focused long enough to keep one room clean, I keep jumping tasks, one to the other." When working with a customer for work, she indicated I'm "all just all over the place."  Concentration difficulties were described as increasing her stress and worries about job performance.    Language: The patient reported no difficulties with basic expressive and receptive language. She denied any changes or difficulties with reading, writing, or doing basic math.    Visual-Spatial: The patient denied any significant changes or difficulties with respect to visual-spatial functioning.    Executive Functioning: The patient described being able to solve problems, but with reduced efficiency. She described "going down a rabbit hole" pursuing one tech-support solution that may not be effective, before ultimately changing to the route that will solve the problem. She reported being less organized, noting that she has historically been an organized person. She denied any changes or problems with irritability, impulsivity, or personality change.      Motor/Sensory Complaints: The patient denied any changes in her sense of taste or sense of smell. She  reported gradual declines in vision since adulthood. She wears hearing aids due to moderate hearing loss. She denied any significant improvement in her cognitive difficulties since obtaining hearing aids. With respect to motor functioning, the patient denied any problems with balance, frequent falls, or changes in gait. She endorsed brief spontaneous feelings of lightheadedness which occur once or twice a week. She reported experiencing intermittent, bilateral, hand tremors that occur without clear cause (worse on right) that she suspects began four years ago. She denied any changes in handwriting or micrographia.  Emotional and Behavioral Functioning: The patient reported long-running tendency towards generalized anxiety and worry, but no major mental health concerns until around 2016. Symptoms of anxiety and depression progressed gradually over time within the context of the exceptional challenges of providing care giving while maintaining full time employment. The patient experienced the loss of the loved one in 2018. Additional stressors since that time included significant changes in work responsibilities. She reported significant worry about work as her spouse is unable to work due to symptoms of a recently diagnosed progressive neurological condition. She indicated feeling overwhelmed and that her stress has never returned to baseline since 2016.    She began prescription medication for anxiety and depression symptoms several years ago (bupropion, duloxetine) and doses have remained unchanged. She reported initial improvement in anxiety. She has been on stimulant medication for attention difficulties for approximately three years. She was on Vyvance for approximately two years (no significant symptom improvement) before switching to Adderall. As noted, cognitive symptoms improved temporarily upon switching stimulant medications.     She engaged in individual therapy sometime after 2020, completing 10  sessions. She described some ambivalence when asked if she found it helpful. The patient indicated that she continues to experience generalized worry and feels her stress levels have never returned  to baseline. She reported experiencing one panic attack several years ago. With respect to mood, she described reduced energy and indications of anhedonia, but no frequent sadness. She acknowledged that she would likely benefit from taking more time to care for herself but struggles to find time to do so. The patient denied any history of suicidal ideation, mania, hallucination, symptoms of PTSD, or other severe psychiatric symptoms.    Sleep: She has experienced a disruption to her sleep schedule as she has been moved to the 2pm-11pm shift at the start of January this year but has historically been a "morning person."  She reported difficulty falling asleep, needing to "unwind from work," but no difficulty with maintenance. She currently sleeps around five to six hours. She typically needs six to seven hours to feel rested. She denied any indications of RBD.  Appetite: She reported increased appetite, citing it as a coping tendency.  Caffeine: 2-3 cups of coffee per day. Alcohol Use: Denied any current use. Tobacco Use: Denied any current use. Recreational Substance Use: Denied any current use.    Level of Functional Independence: The patient is intact with all basic and instrumental activities of daily living. She denied any problems with managing medications, finances, meal preparation, or doing household tasks (excluding distractibility). She drives independently and without difficulty. She reported that she began using her phone calendar to track appointments and future obligations.     Medical History/Record Review: Her medical history includes hypertension (medication managed), arthritis, shoulder surgery (2017), headaches three to four times per week (responsive to OTC medication), gastric lap band  surgery (removed 2014). She denied any history of cancer, stroke, TBI, heart attack, or seizure. She denied experience chronic pain overall. Per records and patient report, the patient was previously diagnosed with sleep apnea, but after losing weight she reported being told she no longer needed to use a CPAP.    Family Neurologic/Medical Hx: The patient has a family history of suspected Alzheimer's disease in her mother and maternal grandmother (onset mid to late 21s for both), ADHD diagnosis in biological sister, ASD diagnosis in offspring. The patient denied any history of Parkinson's disease or essential tremor.    Imaging/Lab Work: Per records, the patient underwent a head CT in March of 2024. Findings were reportedly unremarkable. 2024 lab work ordered by the referring provider were reportedly normal.    Medications: Per patient report: Bupropion 150 mg BID Duloxetine 60 mg daily Adderall (generic) 30 mg ER BID Amlodipine besylate 5 mg daily valsartan-hydrochlorothiazide 80mg    Educational/Vocational History: The patient reported struggling in grade school, having difficulty across all subjects. She reported difficulty with reading comprehension and information retention, and writing. 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 She did report struggles with attention and concentration. She denied any behavioral issues. She indicated that she likely earned Cs-Bs overall, but with concerted effort. Her favorite subject was math.   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 She indicated she was better able to focus when working towards her cosmetology certificate, and later with computer-repair/tech support, as the subject matter interested her. 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 She has working in this format for around a year and  continues to struggle with the change. She describes worry about her performance metrics and has received feedback from her boss commenting on it. She indicated that she is always able to resolve the issue but takes a less efficient route than has historically been the case for her.    Psychosocial: Marital Status: The patient has been married to her current husband for 40 years. She had one prior marriage.   Children/Grandchildren- The patient has two adult children. Living Situation- The patient lives with her spouse and son.  Daily Activities/Hobbies- The patient described enjoying watching old television shows and playing her ukulele. She meets with friends on a weekly basis. She acknowledges that she should make more time for herself, but struggles given work and home responsibilities.    Tests Administered: Automatic Data Edition (BNT-2) Brief Visuospatial Memory Test-Revised (BVMT-R) Benton Judgment of Line Orientation (JOLO) DIRECTV Verbal Learning Test-Third Edition (CVLT-3) Conners Continuous Performance Test 3rd Edition (CPT-3) Controlled Oral Word Association Test (FAS & Animals) Delis-Kaplan Executive Function System (D-KEFS), select subtests Grooved Pegboard Test Rey Complex Figure Test (RCFT), select subtests Trail Making Test (TMT; Part A & B) Wechsler Adult Intelligence Scale-Fourth Edition (WAIS-IV), select subtests Wechsler Abbreviated Scale of Intelligence Civil Service fast streamer) Wechsler Memory Scale-Fourth Edition (WMS-IV)  Wechsler Memory Scale-Third Edition (WMS-III)  The Beck Depression Inventory-II (BDI-II) Beck Anxiety Inventory (BAI)   Behavioral Observations: Per technician report, the patient was polite, and behavior was appropriate  for the situation. She remained engaged and cooperative throughout the session. No difficulties with comprehension of test instructions were noted. There were some indications that the patient viewed her own performance negatively at times. A mild hand tremor, in action, was observed in predominantly in right hand, and this may have impacted speeded motor dexterity performance. She ambulated independently and at a typical pace. Hearing and vision were adequate for testing demands.   Neuropsychodiagnostic Findings  Intellectual Functioning Norm Score Percentile   Range  WASI-II FSIQ-2     SS = 97 42 %ile Average    WASI-II Vocabulary      t = 50 50 %ile Average    WASI-II Matrix Reasoning      t = 47 37 %ile Average   The patient's performance on a measure estimating intellectual ability and premorbid cognitive functioning was scored within the average range. This is consistent with expectations based on academic and professional history. The patient's scores on component measures were consistent. She had average range scores for both a measure of crystallized word-knowledge and a measure of abstract non-verbal reasoning.   ATTENTION AND WORKING MEMORY Norm Score Percentile   Range  WAIS-IV                    Digit Span     ss = 10 50 %ile Average    DSF     ss = 11 63 %ile Average    Span:          7          DSB     ss = 10 50 %ile Average    Span:         4          DSS     ss = 10 50 %ile Average    Span:         6        WMS-III  Spatial Span     ss = 10 50 %ile Average    SSF     ss = 9 37 %ile Average    Span:         5          SSB     ss = 10 50 %ile Average    Span:         5          The patient's overall performance on a measure of auditory-verbal attention was within the average range. She scored consistently across component measures which included one measure of basic span of auditory attention and two measures with increased demands on auditory  working-memory. She scored similarly on a measure of attention with visual stimuli. She scored within the average range overall and with both basic span and working memory.   COMPLEX ATTENTION Norm/Percentile Range  CPT-3 04/15/23 (On-Meds)  (CPT-3 Specific Score Descriptors)    Response Style T=35 Speed over Accuracy    Detectability (reverse scored) T=45    28%ile Average     Omission Errors T=44   11%ile Low    Commission Errors T=54   75%ile Average     Perseverations T=47   34%ile Average     HRT (reaction time) T=63   94%ile Slow    HRT SD (reaction time consistency) T=41   4%ile Low (good consistency)     Variability (in RT consistency) T=49   64%ile Average     HRT Block Change (over test) T=42   18%ile Low (good performance)    HRT ISI Change (by stimuli interval) T=52   66%ile Average     Norm/Percentile Range  CPT-3 (04/18/2023, "Off-Meds")  (CPT-3 Specific Score Descriptors)    Response Style T=33 Speed over Accuracy    Detectability (reverse scored) T=59   83%ile High Average     Omission Errors T=46   47%ile Average    Commission Errors T=71   97%ile Very Elevated     Perseverations T=47   34%ile Average     HRT (reaction time) T=54   78%ile Average    HRT SD (reaction time consistency) T=45   39%ile Average    Variability (in RT consistency) T=55   81%ile High Average (less consistent)    HRT Block Change (over test) T=42   18%ile Low (good performance)    HRT ISI Change (by stimuli interval) T=52   66%ile Average     Norm/Percentile Norm/Percentile Statistically Significant Change   CPT-3 COMPARISON 04/15/23 (On-Meds) 04/18/23 (Off-Meds)     Response Style T=35 T=33 n/a    Detectability (reverse scored) T=45    28%ile T=59   83%ile Performance decline    Omission Errors T=44   11%ile T=46   47%ile No    Commission Errors T=54   75%ile T=71   97%ile Performance decline     Perseverations T=47   34%ile T=47   34%ile No     HRT (reaction time) (reverse scored) T=63   94%ile  T=54   78%ile Speed (mean RT) increase     HRT SD (reaction time consistency) T=41   4%ile T=45   39%ile No    Variability (in RT consistency) T=49   64%ile T=55   81%ile No    HRT Block Change (over test) T=42   18%ile T=42   18%ile No    HRT ISI Change (by stimuli interval) T=52   66%ile T=52   66%ile No  The patient completed a continuous performance task assessing complex attention and impulsivity on-medication (dose morning of 04/15/23) and off medication (did not dose that morning, 04/18/23). Embedded validity measures were within normal limits for both administrations. On-medication, her overall profile showed no compelling evidence of problems with sustaining attention over time, or vigilance. She scored low (slower than average) on the measure of reaction time, possibly reflecting weaknesses in efficient processing or possible inattention. Off-medication, her overall pattern showed in no clear difficulties with sustaining attention over time or with vigilance. There were indications of possible mild problems with inattentiveness and / or possibly impulsivity, with elevated commission errors and slightly high-speed variability.   Conclusions between on- and off-medication performances should be interpreted with some caution given random error inherent in any performance measure, and given that testing on- and off-meds was not done "blind" and thus more susceptible to placebo effects. That said, comparison between scores from on- and off-medication administrations showed poorer detectability and commission errors when off-medications, a difference that was statistically significant. Interestingly, her average reaction time was faster with the second administration, which, when combined with a liberal response style (favoring speed over accuracy) may account for the considerable increase in commission errors.     PROCESSING SPEED Norm Score Percentile   Range  WAIS-IV                    Coding      ss = 9 37 %ile Average    Cancellation     ss = 8 25 %ile Average   The patient completed two tasks assessing processing speed. She scored within the average range on a rapid digit-symbol translation task. The patient also completed a measure of processing speed with lower demands on fine motor control to address possible interference from difficulties with speeded motor dexterity. This task emphasized visual scanning speed and selective attention. She scored within the average range, albeit at the lower limit of that score range.   PSYCHOMOTION Norm Score Percentile   Range  Grooved Pegboard - Dominant     t = 35 7 %ile Below Average    # of drops         2        Grooved Pegboard - Nondominant     t = 40 16 %ile Low Average    # of drops         1          The patient's speeded motor control was scored in the below average range for her dominant hand (right) and in the low average range for her non-dominant hand. Performance may have been impacted by hand tremor. While scores with her dominant and non-dominant hand fall within different descriptive ranges, the score difference does not reach statistical significance (i.e., at least 1 SD).  LANGUAGE Norm Score Percentile   Range  Boston Naming Test (BNT-2)     t = 48 42 %ile Average  COWAT                    FAS     t = 46 32 %ile Average    Animals     t = 34 5 %ile Below Average   As noted, the patient's conversational language was typical in tone, intensity, and rate. Speech content was goal oriented, and no obvious word-finding or word-substitution errors were noted. Her performance was average by age and education on a confrontation-naming/word-retrieval task. She scored  within the average range with phonemic verbal fluency, and in the below average range with semantic verbal fluency.   EXECUTIVE FUNCTIONING Norm Score Percentile   Range  DKEFS - Color-Word Interference                    Color Naming     ss = 11 63 %ile Average    Word  Reading     ss = 8 25 %ile Average    Inhibition     ss = 8 25 %ile Average    Errors     ss = 11 63 %ile Average    Inhibition Switching     ss = 11 63 %ile Average    Errors     ss = 12 75 %ile High Average    Inhibit - Color     ss = 7 16 %ile Low Average    Inhibition/Switching - Inhibit     ss = 13 84 %ile High Average  Trails A     t = 42 21 %ile Low Average    Errors                  Trails B     t = 49 45 %ile Average    Errors                   DKEFS - Verbal Fluency                    Category Switching     ss = 11 63 %ile Average    Switching Accuracy     ss = 11 63 %ile Average   Basic psychomotor sequencing was low average for speed. She scored solidly within the average range when a set-shifting element was added. This difference was not statistically significant. Her rapid color naming and color-word reading speeds were both average by age. There was a statistically significant difference between these scores, favoring color-naming over color-word reading speed. Slightly weaker word-reading speed would be consistent with patient reported weaknesses in reading in school during her youth. The patient scored within the average range with a basic response-inhibition task in both speed and total errors by age. Relative to her baseline color-naming speed, her speed was low average on the response inhibition condition. When set-shifting was combined with response-inhibition, she scored average for speed and high average for total errors. Relative to her inhibition performance, she was high average in speed for the combined inhibition/switching condition. Similarly, she had no difficulties on a semantic verbal fluency task which had a set-shifting element, scoring average in total words and accuracy. Overall, results show no difficulties with set-shifting. While her speed slowed slightly more than expected relative to her baseline on the basic response inhibition task, she was intact with  accuracy.              MEMORY Norm Score Percentile   Range  BVMT-R                    Trial 1     t = 49.0 45 %ile Average    Trial 2     t = 46 32 %ile Average    Trial 3     t = 55.0 68 %ile Average    Total Recall     t = 50.0 50 %ile Average    Learning     t = 57.0 75 %ile  High Average    Delayed Recall     t = 58.0 79 %ile High Average    % Retained           >16 %ile WNL    Hits           6-10 %ile Low Average    False Alarms           >16 %ile WNL    Recognition Discriminability           6-10 %ile Low Average  CVLT-III                    Trial 1     ss = 10.0 50 %ile Average    Trial 2     ss = 13.0 84 %ile High Average    Trial 3     ss = 14.0 91 %ile Above Average    Trial 4     ss = 13.0 84 %ile High Average    Trial 5     ss = 13.0 84 %ile High Average    Trials 1-5 Total     SS = 115 84 %ile High Average    Trial B     ss = 11.0 63 %ile Average    Short Delay Free Recall     ss = 15.0 95 %ile Above Average    Short Delay Cued Recall     ss = 16.0 98 %ile Exceptionally High    Long Delay Free Recall     ss = 15.0 95 %ile Above Average    Long Delay Cued Recall     ss = 15.0 95 %ile Above Average    List B vs. Trial 1     ss = 11.0 63 %ile Average    LD vs. SD     ss = 13.0 84 %ile High Average    SD vs. Trial 5     ss = 15.0 95 %ile Above Average    Total Repetitions     ss = 12.0 75 %ile High Average    Total Intrusions     ss = 11.0 63 %ile Average    Total Hits     ss = 14.0 91 %ile Above Average    Total False Positives     ss = 14.0 91 %ile Above Average    Recognition Discriminability     ss = 15.0 95 %ile Above Average  Wechsler Memory Scale, 4th Edition (WMS-4)              Log. Mem. Immediate Recall     ss = 12 75 %ile High Average    Logical Memory Delayed Recall     ss = 14 91 %ile Above Average    Logical Recognition         >75th %ile %ile Above Average    The patient was asked to learn and recall verbally and visually based information. On the  visually based memory task, scored within the average range for total information encoded across three learning trials. Her learning performance (improvement with repetition) was high average. Her delayed free recall was high average and she demonstrated no information loss over time. Her performance on the delayed recognition measure was low average for discrimination of target versus non-target stimuli, with low average true positives and average false positives. Qualitatively, her endorsements on the recognition measure were consistent with the items she had originally encoded.  Verbal memory was assessed via a word-list memory test and a prose memory test. With the prose memory test, she scored in the high average for immediate recall and above average for delayed free recall and delayed recognition of story details. On the word-list memory test, her encoding performance across five learning trials was high average overall. Immediate recall of an interference list was average and consistent with her encoding of her first trial of the primary list. She scored above average with delayed free recall following both short and long delays. Her cued recall was in the exceptionally high range following a short delay, and above average following a long delay. She had no difficulty with delayed recognition and scored above average with true positives, false positives, and overall discriminability.   VISUAL-SPATIAL Norm Score Percentile   Range  [place] JOLO           56 %ile Average  Rey Complex Figure Copy           >16 %ile WNL    The patient scored within the average range on a task assessing visuospatial perception requiring her to judge line orientations. The patient showed no deficits in visual planning or visual-construction on a task requiring her to copy a complex geometric design.   PERSONALITY AND BEHAVIORAL FUNCTIONING      Score/Severity  BDI Raw               18  BDI Severity               Mild   BAI Raw               22  BAI Severity               Moderate   On a self-report measure assessing symptoms of depression, the patient's endorsements showed mild depressive symptom severity. Item endorsements indicated mild difficulties with negative self-perceptions, anhedonia, reduced energy, difficulties with sleep, and problems with focus/concentration. On a self-report measure of anxiety, the patient scored within the moderate symptom severity range. Endorsements included physiological symptoms of anxiety but also some cognitive symptoms. Even when discounting items secondary to other physical causes (e.g., "hot flashes"), the patient's total score remains elevated.     SUMMARY AND FUNCTIONAL IMPLICATIONS  The patient presents with a history of cognitive symptoms involving predominantly attention which began during her youth. Symptoms impacted her school performance as a child, but did not significantly impair her functioning during adulthood. The patient endorsed cognitive changes and worsening of her symptoms around 2017. Changes were gradual in onset and stable in course. Notable psychosocial stressors began around the time of these cognitive changes, and anxiety/depressive related symptoms became notable. Cognitively, the patient described struggling with managing attention and concentration at work and at home. Stimulant medication for ADHD reportedly improved symptoms only briefly. The patient is intact with respect to basic and instrumental activities of daily living and continues to work full time. However, she described receiving some negative feedback at work related to her performance. The patient endorses sleep difficulties starting in 2025 linked to a significant change in her work schedule. Medically, she has hypertension (a risk factor for cerebrovascular disease) and a history of sleep apnea, but no clear medical cause for her cognitive symptoms.  Neuropsychological testing showed  performances almost exclusively at or above premorbid estimates. No evidence of broad-based or domain specific cognitive impairment or evidence of cognitive decline was found in testing. Her cognitive profile is not consistent with  an Alzheimer disease (AD) process. While she did score below expected on a subtest involving semantic verbal fluency, she showed no deficits in other areas of language. Her scores in memory testing argue strongly against AD as she scored consistently above the average range on all delayed free-recall measures, indicating no issues with retaining information over time.   The patient presents with a history that would likely meet diagnostic criteria for ADHD during her youth. She describes persistence of these symptoms into adulthood and to present day, although would likely not meet full diagnostic criteria for most of adulthood given the absence of functional impairment. Effectively, she appears to have been effectively in managing her symptoms for most of her life. The patient indicated that she has begun struggling at work and home around 2017. These changes coincide closely with the start of intense psychosocial stressors/chronic stress, significant symptoms of anxiety, and mild depressive symptoms. Taken as a whole, the patient's presenting concerns are most likely a consequence of premorbid cognitive difficulties exacerbated by psychosocial stressors rather than onset of a new disease process.   Diagnostically, a formal cognitive diagnosis will not be made at this time given the absence of clear measurable cognitive decline. Conservatively, a formal diagnosis of ADHD will not be made at this time as it is she may not meet criteria after reductions in stress / anxiety related symptoms. The patient presents with a history suspicious for generalized anxiety disorder, but external stressors and possible influence from medication side effects may account for clinically significant  symptoms. An Unspecific Anxiety Disorder diagnosis is more appropriate in this case.    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 Reductions in symptoms of anxiety are likely to indirectly improve cognitive functioning.  The patient is currently prescribed medications for anxiety/mood and attention/concentration. A discussion between the patient and prescribing physician to review current medications may be beneficial. Side effects from current medications may potentially exacerbate symptoms of anxiety. The writer ultimately defers to the prescribing physician and patient regarding medication decisions.  Recommendations for improving symptoms of anxiety and managing stress: A brief duration of individual therapy targeting stress and anxiety management may be helpful. www.psychologytoday.com has a International aid/development worker of therapists (filterable by specialty, location, insurances accepted, etc.) should the patient be interested.   As discussed during feedback, re-introducing previously enjoyable activities into daily life can go a long way to improving psychological wellness and reduce the impact of stress. Specific activities identified by the patient: spending time relaxing in the gazebo; playing Ukelele; doing crafts; doing jigsaw puzzles.  Enlisting the help of others for some household tasks/chores and other activities can help reduce stress levels and provide time for self-care.  Poor sleep quality can readily impact attention/concentration and other cognitive abilities. Sleep hygiene tips are included below the signature line of this report.  The patient is encouraged to continue engaging with medical providers for management of chronic  health conditions. Management of cardiovascular health is recommended given risk associated with cerebrovascular health conditions.  The patient's relatively recent onset of tremor warrants mention. If desired by the patient and referring provider an evaluation by neurology would be reasonable if only out of caution. Should it worsen, an evaluation would be more strongly indicated.  The current evaluation provides a clear picture of the patient's  current cognitive functioning and can serve as a comparison point in the future. The patient is welcomed to return for a re-evaluation in the future should there be concerns for cognitive decline, at which point change over time can be quantified. There is no indication for re-evaluation need at this time, however. Please feel free to reach out to me should you have any questions or concerns regarding the above findings and recommendations.    This report was generated using voice recognition software. While this document has been carefully reviewed, transcription errors may be present. I apologize in advance for any inconvenience. Please contact me if further clarification is needed.             Thelma Comp, PsyD             Neuropsychologist   Sleep Hygiene: Below is an abbreviated list of recommendations for improving sleep hygiene / sleep quality.  Keep a consistent sleep schedule. Get up at the same time every day, even on weekends or during vacations. Only lay in bed when sleepy, as this will help create a better association between your bed and a good night rest. Reduce light exposure; utilize dim lighting in the evening and particularly around the time you are going to bed. Bright, and even typical room lighting can impact circadian rhythms and melatonin release (both important for sleep). If you don't fall asleep after 20 minutes, get out of bed. Go do a quiet activity without a lot of light exposure. Make your bedroom quiet and relaxing. Do  not use electronic devices in bed.  Keep the room at a comfortable, cool temperature. Exercise regularly and maintain a healthy diet.  Avoid consuming caffeine in the afternoon or evening. Avoid consuming alcohol before bedtime.  Avoid taking naps throughout the day, when possible. If waking and struggling to return to sleep: If unable to fall back asleep within about 20 minutes, get out of bed and do something relaxing to distract yourself, but avoid full lighting/blue light and cognitively demanding/stimulating tasks.  Engaging in relaxation exercises or meditation exercises can be helpful. A number of guided relaxation exercises can be found on youtube (identify some to use ahead of time so you don't need to search for one in the moment).

## 2023-04-17 ENCOUNTER — Encounter: Payer: Self-pay | Admitting: Psychology

## 2023-04-17 ENCOUNTER — Encounter: Payer: 59 | Admitting: Psychology

## 2023-04-18 ENCOUNTER — Encounter: Admitting: Psychology

## 2023-04-18 DIAGNOSIS — R41840 Attention and concentration deficit: Secondary | ICD-10-CM

## 2023-04-18 DIAGNOSIS — F419 Anxiety disorder, unspecified: Secondary | ICD-10-CM | POA: Diagnosis not present

## 2023-04-18 DIAGNOSIS — R4189 Other symptoms and signs involving cognitive functions and awareness: Secondary | ICD-10-CM | POA: Diagnosis not present

## 2023-04-20 ENCOUNTER — Encounter: Payer: Self-pay | Admitting: Psychology

## 2023-04-20 NOTE — Progress Notes (Signed)
 Patient: Deanna Edwards  MRN: [id number] DOB: 06-Feb-1961   Neuropsychologist: Thelma Comp, PsyD   Date of Service: 04/18/23 Start Time: 1:00 PM End Time: 2:00 PM   Location of Service:  [facility] [place]. [facility] [address] [place] [place], [place] [address] Phone: [phone]   Billing Code/Service: 96132/ Feedback for Neuropsychological Evaluation  Individuals present: Patient was seen by the writer. The patient was unaccompanied.    The provider reviewed and dicussed the results of the neuropsychological evaluation including findings, diagnosis, and recommendations. The patient expressed understanding of the information reviewed. The patient was provided opportunity to ask questions which were answered by the provider. The provider worked collaboratively to tailor treatment recommendations to the patient when possible. The provider informed the patient she would receive a copy of the report by mail and encouraged to reach out with any additional questions.   The final neuropsychological evaluation report, found within encounter on 04/16/23, was updated with recommendations and amended / finalized based on provider patient discussion during the feedback appointment.

## 2023-07-31 ENCOUNTER — Encounter: Payer: 59 | Admitting: Psychology
